# Patient Record
Sex: Female | Born: 1940 | Race: White | Hispanic: No | Marital: Married | Smoking: Never smoker
Health system: Southern US, Community
[De-identification: ages and names within clinical notes are randomized; demographics above are authoritative.]

## PROBLEM LIST (undated history)

## (undated) DIAGNOSIS — I10 Essential (primary) hypertension: Secondary | ICD-10-CM

## (undated) DIAGNOSIS — I480 Paroxysmal atrial fibrillation: Secondary | ICD-10-CM

## (undated) DIAGNOSIS — F0390 Unspecified dementia without behavioral disturbance: Secondary | ICD-10-CM

## (undated) DIAGNOSIS — I428 Other cardiomyopathies: Secondary | ICD-10-CM

## (undated) DIAGNOSIS — N183 Chronic kidney disease, stage 3 unspecified: Secondary | ICD-10-CM

## (undated) DIAGNOSIS — C50919 Malignant neoplasm of unspecified site of unspecified female breast: Secondary | ICD-10-CM

## (undated) DIAGNOSIS — E785 Hyperlipidemia, unspecified: Secondary | ICD-10-CM

## (undated) DIAGNOSIS — E039 Hypothyroidism, unspecified: Secondary | ICD-10-CM

## (undated) DIAGNOSIS — E079 Disorder of thyroid, unspecified: Secondary | ICD-10-CM

## (undated) HISTORY — PX: BREAST SURGERY: SHX581

## (undated) HISTORY — PX: APPENDECTOMY: SHX54

---

## 2000-05-16 ENCOUNTER — Encounter: Admission: RE | Admit: 2000-05-16 | Discharge: 2000-05-16 | Payer: Self-pay | Admitting: Obstetrics & Gynecology

## 2000-05-16 ENCOUNTER — Encounter: Payer: Self-pay | Admitting: Family Medicine

## 2000-05-19 ENCOUNTER — Encounter: Payer: Self-pay | Admitting: Family Medicine

## 2000-05-19 ENCOUNTER — Encounter: Admission: RE | Admit: 2000-05-19 | Discharge: 2000-05-19 | Payer: Self-pay | Admitting: Obstetrics & Gynecology

## 2002-01-25 ENCOUNTER — Encounter: Payer: Self-pay | Admitting: Family Medicine

## 2002-01-25 ENCOUNTER — Encounter: Admission: RE | Admit: 2002-01-25 | Discharge: 2002-01-25 | Payer: Self-pay | Admitting: Family Medicine

## 2002-01-29 ENCOUNTER — Encounter: Admission: RE | Admit: 2002-01-29 | Discharge: 2002-01-29 | Payer: Self-pay | Admitting: General Surgery

## 2002-01-29 ENCOUNTER — Encounter: Payer: Self-pay | Admitting: General Surgery

## 2002-02-01 ENCOUNTER — Ambulatory Visit (HOSPITAL_BASED_OUTPATIENT_CLINIC_OR_DEPARTMENT_OTHER): Admission: RE | Admit: 2002-02-01 | Discharge: 2002-02-01 | Payer: Self-pay | Admitting: General Surgery

## 2002-02-01 ENCOUNTER — Encounter: Admission: RE | Admit: 2002-02-01 | Discharge: 2002-02-01 | Payer: Self-pay | Admitting: General Surgery

## 2002-02-01 ENCOUNTER — Encounter: Payer: Self-pay | Admitting: General Surgery

## 2002-02-11 ENCOUNTER — Ambulatory Visit: Admission: RE | Admit: 2002-02-11 | Discharge: 2002-05-12 | Payer: Self-pay | Admitting: Radiation Oncology

## 2002-12-24 ENCOUNTER — Encounter: Payer: Self-pay | Admitting: General Surgery

## 2002-12-24 ENCOUNTER — Encounter: Admission: RE | Admit: 2002-12-24 | Discharge: 2002-12-24 | Payer: Self-pay | Admitting: General Surgery

## 2011-04-11 ENCOUNTER — Emergency Department (HOSPITAL_COMMUNITY): Payer: Medicare Other

## 2011-04-11 ENCOUNTER — Inpatient Hospital Stay (HOSPITAL_COMMUNITY)
Admission: EM | Admit: 2011-04-11 | Discharge: 2011-04-13 | DRG: 690 | Disposition: A | Discharge status: Home / Self Care | Payer: Medicare Other | Attending: Internal Medicine | Admitting: Internal Medicine

## 2011-04-11 ENCOUNTER — Encounter: Payer: Self-pay | Admitting: Oncology

## 2011-04-11 DIAGNOSIS — E119 Type 2 diabetes mellitus without complications: Secondary | ICD-10-CM | POA: Diagnosis present

## 2011-04-11 DIAGNOSIS — R630 Anorexia: Secondary | ICD-10-CM | POA: Diagnosis present

## 2011-04-11 DIAGNOSIS — E039 Hypothyroidism, unspecified: Secondary | ICD-10-CM | POA: Diagnosis present

## 2011-04-11 DIAGNOSIS — R634 Abnormal weight loss: Secondary | ICD-10-CM

## 2011-04-11 DIAGNOSIS — F0390 Unspecified dementia without behavioral disturbance: Secondary | ICD-10-CM | POA: Diagnosis present

## 2011-04-11 DIAGNOSIS — E079 Disorder of thyroid, unspecified: Secondary | ICD-10-CM | POA: Diagnosis present

## 2011-04-11 DIAGNOSIS — Z853 Personal history of malignant neoplasm of breast: Secondary | ICD-10-CM

## 2011-04-11 DIAGNOSIS — R599 Enlarged lymph nodes, unspecified: Secondary | ICD-10-CM | POA: Diagnosis present

## 2011-04-11 DIAGNOSIS — I10 Essential (primary) hypertension: Secondary | ICD-10-CM | POA: Diagnosis present

## 2011-04-11 DIAGNOSIS — E785 Hyperlipidemia, unspecified: Secondary | ICD-10-CM | POA: Diagnosis present

## 2011-04-11 DIAGNOSIS — R911 Solitary pulmonary nodule: Secondary | ICD-10-CM | POA: Diagnosis present

## 2011-04-11 DIAGNOSIS — E860 Dehydration: Secondary | ICD-10-CM | POA: Diagnosis present

## 2011-04-11 DIAGNOSIS — N390 Urinary tract infection, site not specified: Secondary | ICD-10-CM | POA: Diagnosis present

## 2011-04-11 HISTORY — DX: Hyperlipidemia, unspecified: E78.5

## 2011-04-11 HISTORY — DX: Disorder of thyroid, unspecified: E07.9

## 2011-04-11 HISTORY — DX: Unspecified dementia, unspecified severity, without behavioral disturbance, psychotic disturbance, mood disturbance, and anxiety: F03.90

## 2011-04-11 HISTORY — DX: Malignant neoplasm of unspecified site of unspecified female breast: C50.919

## 2011-04-11 HISTORY — DX: Essential (primary) hypertension: I10

## 2011-04-11 LAB — CARDIAC PANEL(CRET KIN+CKTOT+MB+TROPI)
CK, MB: 1.4 ng/mL (ref 0.3–4.0)
Total CK: 29 U/L (ref 7–177)

## 2011-04-11 LAB — PROTIME-INR: INR: 1.14 (ref 0.00–1.49)

## 2011-04-11 LAB — URINE MICROSCOPIC-ADD ON

## 2011-04-11 LAB — BASIC METABOLIC PANEL
BUN: 19 mg/dL (ref 6–23)
Chloride: 102 mEq/L (ref 96–112)
Glucose, Bld: 116 mg/dL — ABNORMAL HIGH (ref 70–99)
Potassium: 3.8 mEq/L (ref 3.5–5.1)

## 2011-04-11 LAB — TSH: TSH: 7.47 u[IU]/mL — ABNORMAL HIGH (ref 0.350–4.500)

## 2011-04-11 LAB — URINALYSIS, ROUTINE W REFLEX MICROSCOPIC
Nitrite: NEGATIVE
Specific Gravity, Urine: >1.03 — ABNORMAL HIGH (ref 1.005–1.030)
pH: 5.5 (ref 5.0–8.0)

## 2011-04-11 LAB — CBC
Hemoglobin: 12 g/dL (ref 12.0–15.0)
RBC: 3.76 MIL/uL — ABNORMAL LOW (ref 3.87–5.11)
WBC: 9.3 10*3/uL (ref 4.0–10.5)

## 2011-04-11 LAB — DIFFERENTIAL
Lymphocytes Relative: 11 % — ABNORMAL LOW (ref 12–46)
Lymphs Abs: 1.1 10*3/uL (ref 0.7–4.0)
Monocytes Relative: 9 % (ref 3–12)
Neutro Abs: 7.3 10*3/uL (ref 1.7–7.7)
Neutrophils Relative %: 78 % — ABNORMAL HIGH (ref 43–77)

## 2011-04-11 LAB — HEPATIC FUNCTION PANEL
Albumin: 2.6 g/dL — ABNORMAL LOW (ref 3.5–5.2)
Total Protein: 6.9 g/dL (ref 6.0–8.3)

## 2011-04-11 LAB — D-DIMER, QUANTITATIVE: D-Dimer, Quant: 1.15 ug/mL-FEU — ABNORMAL HIGH (ref 0.00–0.48)

## 2011-04-11 LAB — LIPASE, BLOOD: Lipase: 38 U/L (ref 11–59)

## 2011-04-11 MED ORDER — ONDANSETRON HCL 4 MG PO TABS
4.0000 mg | ORAL_TABLET | Freq: Four times a day (QID) | ORAL | Status: DC | PRN
  Administered 2011-04-12: 4 mg via ORAL
  Filled 2011-04-11: qty 1

## 2011-04-11 MED ORDER — FENOFIBRATE 160 MG PO TABS
160.0000 mg | ORAL_TABLET | ORAL | Status: DC
  Administered 2011-04-12 – 2011-04-13 (×2): 160 mg via ORAL
  Filled 2011-04-11 (×4): qty 1

## 2011-04-11 MED ORDER — ONDANSETRON HCL 4 MG/2ML IJ SOLN: 4.0000 mg | Freq: Four times a day (QID) | INTRAMUSCULAR | Status: DC | PRN

## 2011-04-11 MED ORDER — TRAZODONE HCL 50 MG PO TABS: 25.0000 mg | ORAL_TABLET | Freq: Every evening | ORAL | Status: DC | PRN

## 2011-04-11 MED ORDER — DONEPEZIL HCL 5 MG PO TABS
10.0000 mg | ORAL_TABLET | Freq: Every day | ORAL | Status: DC
  Administered 2011-04-11 – 2011-04-12 (×2): 10 mg via ORAL
  Filled 2011-04-11 (×2): qty 2

## 2011-04-11 MED ORDER — POLYETHYLENE GLYCOL 3350 17 G PO PACK: 17.0000 g | PACK | Freq: Every day | ORAL | Status: DC | PRN

## 2011-04-11 MED ORDER — OXYCODONE HCL 5 MG PO TABS
5.0000 mg | ORAL_TABLET | ORAL | Status: DC | PRN
  Administered 2011-04-11 – 2011-04-13 (×3): 5 mg via ORAL
  Filled 2011-04-11 (×3): qty 1

## 2011-04-11 MED ORDER — IOHEXOL 350 MG/ML SOLN
80.0000 mL | Freq: Once | INTRAVENOUS | Status: AC | PRN
  Administered 2011-04-11: 80 mL via INTRAVENOUS

## 2011-04-11 MED ORDER — ACETAMINOPHEN 325 MG PO TABS: 650.0000 mg | ORAL_TABLET | Freq: Four times a day (QID) | ORAL | Status: DC | PRN

## 2011-04-11 MED ORDER — ACETAMINOPHEN 650 MG RE SUPP: 650.0000 mg | Freq: Four times a day (QID) | RECTAL | Status: DC | PRN

## 2011-04-11 MED ORDER — BISACODYL 10 MG RE SUPP: 10.0000 mg | Freq: Every day | RECTAL | Status: DC | PRN

## 2011-04-11 MED ORDER — INSULIN ASPART 100 UNIT/ML ~~LOC~~ SOLN: 0.0000 [IU] | Freq: Every day | SUBCUTANEOUS | Status: DC

## 2011-04-11 MED ORDER — LEVOTHYROXINE SODIUM 75 MCG PO TABS
75.0000 ug | ORAL_TABLET | ORAL | Status: DC
Start: 2011-04-12 — End: 2011-04-13
  Administered 2011-04-12 – 2011-04-13 (×2): 75 ug via ORAL
  Filled 2011-04-11 (×2): qty 1

## 2011-04-11 MED ORDER — FLEET ENEMA 7-19 GM/118ML RE ENEM: 1.0000 | ENEMA | Freq: Once | RECTAL | Status: AC | PRN

## 2011-04-11 MED ORDER — SIMVASTATIN 20 MG PO TABS: 40.0000 mg | ORAL_TABLET | Freq: Every day | ORAL | Status: DC

## 2011-04-11 MED ORDER — SODIUM CHLORIDE 0.9 % IV SOLN
INTRAVENOUS | Status: DC
  Administered 2011-04-11: 19:00:00 via INTRAVENOUS

## 2011-04-11 MED ORDER — SODIUM CHLORIDE 0.9 % IV SOLN
INTRAVENOUS | Status: DC
  Administered 2011-04-11: 23:00:00 via INTRAVENOUS
  Administered 2011-04-11: 1000 mL via INTRAVENOUS
  Administered 2011-04-12: 07:00:00 via INTRAVENOUS

## 2011-04-11 MED ORDER — AMLODIPINE BESYLATE 5 MG PO TABS
10.0000 mg | ORAL_TABLET | Freq: Every day | ORAL | Status: DC
  Administered 2011-04-11 – 2011-04-12 (×2): 10 mg via ORAL
  Filled 2011-04-11 (×2): qty 2

## 2011-04-11 MED ORDER — HYDROMORPHONE HCL PF 1 MG/ML IJ SOLN: 0.5000 mg | INTRAMUSCULAR | Status: DC | PRN

## 2011-04-11 MED ORDER — INSULIN ASPART 100 UNIT/ML ~~LOC~~ SOLN: 0.0000 [IU] | Freq: Three times a day (TID) | SUBCUTANEOUS | Status: DC

## 2011-04-11 NOTE — ED Notes (Signed)
Pt arrived via [facility] - family reports pt has had a several day history of poor intake and generalized weakness.  Pt denies cp, shob, n/v/d, or fevers.

## 2011-04-11 NOTE — ED Notes (Signed)
Report called  

## 2011-04-11 NOTE — ED Provider Notes (Signed)
History     CSN: [id number] Arrival date & time: 04/11/2011  1:03 PM   First MD Initiated Contact with Patient 04/11/11 1407      Chief Complaint  Patient presents with  . Weakness    (Consider location/radiation/quality/duration/timing/severity/associated sxs/prior treatment) HPI Comments: Pt is a 70year-old who has not been eating for over a week. Her family is concerned, and they called her primary care physician, Rudi Heap M.D., who advised evaluation in [facility] for this condition. She has a prior history of breast cancer, and has dementia.  Patient is a 70 y.o. female presenting with weakness. The history is provided by a relative and medical records. No language interpreter was used.  Weakness The primary symptoms include altered mental status and memory loss. Primary symptoms comment: She has not eaten much in the past week or so. The symptoms began more than 1 week ago. The symptoms are unchanged. The neurological symptoms are diffuse.  Additional symptoms include weakness and irritability. Medical issues also include cancer.    Past Medical History  Diagnosis Date  . Dementia   . Thyroid disease   . Diabetes mellitus   . Hypertension   . Hyperlipidemia   . Breast cancer     History reviewed. No pertinent past surgical history.  History reviewed. No pertinent family history.  History  Substance Use Topics  . Smoking status: Not on file  . Smokeless tobacco: Not on file  . Alcohol Use: No    OB History    Grav Para Term Preterm Abortions TAB SAB Ect Mult Living                  Review of Systems  Unable to perform ROS: Dementia  Constitutional: Positive for irritability.  Neurological: Positive for weakness.  Psychiatric/Behavioral: Positive for memory loss and altered mental status.    Allergies  Penicillins  Home Medications   Current Outpatient Rx  Name Route Sig Dispense Refill  . AMLODIPINE BESYLATE 10 MG PO TABS Oral Take 10 mg by  mouth daily.      . ATORVASTATIN CALCIUM 20 MG PO TABS Oral Take 20 mg by mouth daily.      . DONEPEZIL HCL 10 MG PO TABS Oral Take 10 mg by mouth at bedtime as needed.      . FENOFIBRATE 160 MG PO TABS Oral Take 160 mg by mouth daily.      Marland Kitchen LEVOTHYROXINE SODIUM 75 MCG PO TABS Oral Take 75 mcg by mouth daily.      Marland Kitchen LISINOPRIL 20 MG PO TABS Oral Take 20 mg by mouth daily.      Marland Kitchen LOVASTATIN 40 MG PO TABS Oral Take 40 mg by mouth at bedtime. 2 tablets at hs     . POTASSIUM CHLORIDE CRYS CR 20 MEQ PO TBCR Oral Take 20 mEq by mouth 2 (two) times daily.        BP 140/66  Temp(Src) 98.9 F (37.2 C) (Oral)  Resp 18  SpO2 100%  Physical Exam  Constitutional:       Patient is an elderly woman who seems forgetful.  HENT:  Head: Normocephalic and atraumatic.  Right Ear: External ear normal.  Left Ear: External ear normal.  Mouth/Throat: Oropharynx is clear and moist.  Eyes: Conjunctivae and EOM are normal. Pupils are equal, round, and reactive to light. No scleral icterus.  Neck: Normal range of motion. Neck supple.  Cardiovascular: Normal rate, regular rhythm and normal heart sounds.  Pulmonary/Chest: Effort normal and breath sounds normal.  Abdominal: Soft. Bowel sounds are normal.  Musculoskeletal: Normal range of motion.  Neurological: She is alert.       She is oriented to person and place but not to time.  There is no focal weakness or numbness. She has a disregard for her symptoms.  Skin: Skin is warm and dry.  Psychiatric:       She seems to have poor insight into her condition.    ED Course  Procedures (including critical care time)  Labs Reviewed  CBC - Abnormal; Notable for the following:    RBC 3.76 (*)    All other components within normal limits  DIFFERENTIAL - Abnormal; Notable for the following:    Neutrophils Relative 78 (*)    Lymphocytes Relative 11 (*)    All other components within normal limits  URINALYSIS, ROUTINE W REFLEX MICROSCOPIC - Abnormal; Notable  for the following:    Color, Urine ORANGE (*) BIOCHEMICALS MAY BE AFFECTED BY COLOR   APPearance HAZY (*)    Specific Gravity, Urine >1.030 (*)    Hgb urine dipstick TRACE (*)    Bilirubin Urine SMALL (*)    Protein, ur TRACE (*)    Urobilinogen, UA 2.0 (*)    Leukocytes, UA MODERATE (*)    All other components within normal limits  URINE MICROSCOPIC-ADD ON - Abnormal; Notable for the following:    Squamous Epithelial / LPF MANY (*)    Bacteria, UA MANY (*)    Casts GRANULAR CAST (*) HYALINE CASTS   All other components within normal limits  BASIC METABOLIC PANEL   1:61 PM Patient was seen and had physical examination. Laboratory tests were ordered.  2:55 PM  Date: 04/11/2011  Rate:92  Rhythm: normal sinus rhythm  QRS Axis: left  Intervals: PR prolonged QRS:  Left bundle branch block.    ST/T Wave abnormalities: normal  Conduction Disutrbances:first-degree A-V block  and left bundle branch block  Narrative Interpretation: Abnormal EKG  Old EKG Reviewed: none available  4:27 PM CT of the head and chest x-ray were negative. D-dimer was somewhat elevated. Urinalysis showed a UTI. I discussed all these findings with the patient and her husband and son. CT angiography chest was ordered to check for pulmonary embolism.  5:36 PM CT angio of the chest did not show any pulmonary emboli, but did show pulmonary nodules.  The radiologist recommended comparison with any outside films to check for possible metatstases.  Will call [facility] to admit her for treatment of UTI, testing for possible metastases from breast cancer.  1. Dementia   2. Urinary tract infection   3. Pulmonary nodule           Carleene Cooper III, MD 04/11/11 [phone]

## 2011-04-11 NOTE — ED Notes (Signed)
Alert, no distress, family at bedside.  Attempted to give report, but nurse not ready.

## 2011-04-11 NOTE — H&P (Signed)
PCP:   Redmond Baseman, MD, MD   Chief Complaint:  Not eating for about one week  HPI: Kristen Wheeler is an 70 y.o. Caucasian female.   Multiple stable chronic medical problems, including mild-to-moderate dementia; lives with elderly husband; son comes in to visit frequently. Brought to the emergency room because of noticeable anorexia for the past week; son says she's only eaten one sandwich the whole week, although patient disputes that. Nevertheless because of her dementia patient is very vague when attempting to correct her son. She admits to poor appetite, episodic abdominal discomfort and chest discomfort; and frequent leg cramps. She admits to progressive weight loss over the years; 80 pounds over the past 5 years, at least 10 pounds over the past few weeks, but reports she's happy with the weight loss because she's always had a problem excess weight.  Patient does have a past history of breast cancer, apparently treated with lumpectomy and radiation, has not needed medical treatment for this for over 9 years.  In the emergency room patient was noted to have mild to moderate confusion, she was dehydrated, and for reasons unclear a d-dimer was done and since it was elevated CT angiogram was done also, and this revealed a 7 mm lung nodule. Given her past cancer history the hospitalist service was called to assist with management.  She denies fever cough or cold; denies frequency or dysuria; denies bloody or black stool; denies difficulty with ambulation or balance difficulties; as episodic headaches which have been unchanged for many years; denies significant cough; no smoking history.  Rewiew of Systems:  The patient denies  fever, vision loss, decreased hearing, hoarseness,  syncope, dyspnea on exertion, peripheral edema, balance deficits, hemoptysis, , melena, hematochezia, severe indigestion/heartburn, hematuria, incontinence, genital sores, muscle weakness, suspicious skin lesions,  transient blindness, difficulty walking, depression,  abnormal bleeding, enlarged lymph nodes, angioedema, and breast masses.    Past Medical History  Diagnosis Date  . Dementia   . Thyroid disease   . Diabetes mellitus   . Hypertension   . Hyperlipidemia   . Breast cancer     History reviewed. No pertinent past surgical history.   Medications:  HOME MEDS: Prior to Admission medications   Medication Sig Start Date End Date Taking? Authorizing Provider  amLODipine (NORVASC) 10 MG tablet Take 10 mg by mouth at bedtime.    Yes Historical Provider, MD  atorvastatin (LIPITOR) 20 MG tablet Take 20 mg by mouth at bedtime.    Yes Historical Provider, MD  donepezil (ARICEPT) 10 MG tablet Take 10 mg by mouth at bedtime.    Yes Historical Provider, MD  fenofibrate 160 MG tablet Take 160 mg by mouth every morning.    Yes Historical Provider, MD  levothyroxine (SYNTHROID, LEVOTHROID) 75 MCG tablet Take 75 mcg by mouth every morning.    Yes Historical Provider, MD  lisinopril (PRINIVIL,ZESTRIL) 20 MG tablet Take 20 mg by mouth every morning.    Yes Historical Provider, MD  lovastatin (MEVACOR) 40 MG tablet Take 80 mg by mouth at bedtime. 2 tablets at hs   Yes Historical Provider, MD  potassium chloride SA (K-DUR,KLOR-CON) 20 MEQ tablet Take 20 mEq by mouth 2 (two) times daily.     Yes Historical Provider, MD     Allergies:  Allergies  Allergen Reactions  . Penicillins     Advised was allergic to penicillin early in life.    Social History:   does not have a smoking history on file.  She does not have any smokeless tobacco history on file. She reports that she does not drink alcohol or use illicit drugs.  Family History: History reviewed. No pertinent family history. Coronary artery disease in both parents; mother also diabetic.  Physical Exam: Filed Vitals:   04/11/11 1309 04/11/11 1519 04/11/11 1739 04/11/11 1911  BP: 140/66 143/61 137/63 125/62  Pulse:  94 103 96  Temp: 98.9 F  (37.2 C)     TempSrc: Oral     Resp: 18  16 20   SpO2: 100% 98% 99% 98%   Blood pressure 125/62, pulse 96, temperature 98.9 F (37.2 C), temperature source Oral, resp. rate 20, SpO2 98.00%.  GEN:  Pleasant elderly Caucasian lady lying in the stretcher in no acute distress; cooperative with exam PSYCH:  alert and oriented x1; does not appear anxious does not appear depressed; affect is normal HEENT: Mucous membranes pink and dry and anicteric; PERRLA; EOM intact; no cervical lymphadenopathy nor thyromegaly or carotid bruit; no JVD; Breasts:: Not examined CHEST WALL: No tenderness CHEST: Normal respiration, clear to auscultation bilaterally HEART: Gallop rhythm; 3/6 systolic murmur with accentuated S2 BACK: No kyphosis or scoliosis; no CVA tenderness ABDOMEN:  soft non-tender; no masses, no organomegaly, normal abdominal bowel sounds; no pannus; no intertriginous candida. Rectal Exam: Not done EXTREMITIES:  age-appropriate arthropathy of the hands and knees; no edema; no ulcerations. Genitalia: not examined PULSES: 2+ and symmetric SKIN: Normal hydration no rash or ulceration CNS: Cranial nerves 2-12 grossly intact no focal neurologic deficit   Labs & Imaging Results for orders placed during the hospital encounter of 04/11/11 (from the past 48 hour(s))  CBC     Status: Abnormal   Collection Time   04/11/11  1:21 PM      Component Value Range Comment   WBC 9.3  4.0 - 10.5 (K/uL)    RBC 3.76 (*) 3.87 - 5.11 (MIL/uL)    Hemoglobin 12.0  12.0 - 15.0 (g/dL)    HCT 56.2  13.0 - 86.5 (%)    MCV 98.9  78.0 - 100.0 (fL)    MCH 31.9  26.0 - 34.0 (pg)    MCHC 32.3  30.0 - 36.0 (g/dL)    RDW 78.4  69.6 - 29.5 (%)    Platelets 246  150 - 400 (K/uL)   DIFFERENTIAL     Status: Abnormal   Collection Time   04/11/11  1:21 PM      Component Value Range Comment   Neutrophils Relative 78 (*) 43 - 77 (%)    Neutro Abs 7.3  1.7 - 7.7 (K/uL)    Lymphocytes Relative 11 (*) 12 - 46 (%)    Lymphs  Abs 1.1  0.7 - 4.0 (K/uL)    Monocytes Relative 9  3 - 12 (%)    Monocytes Absolute 0.9  0.1 - 1.0 (K/uL)    Eosinophils Relative 1  0 - 5 (%)    Eosinophils Absolute 0.1  0.0 - 0.7 (K/uL)    Basophils Relative 0  0 - 1 (%)    Basophils Absolute 0.0  0.0 - 0.1 (K/uL)   URINALYSIS, ROUTINE W REFLEX MICROSCOPIC     Status: Abnormal   Collection Time   04/11/11  1:25 PM      Component Value Range Comment   Color, Urine ORANGE (*) YELLOW  BIOCHEMICALS MAY BE AFFECTED BY COLOR   APPearance HAZY (*) CLEAR     Specific Gravity, Urine >1.030 (*) 1.005 - 1.030  pH 5.5  5.0 - 8.0     Glucose, UA NEGATIVE  NEGATIVE (mg/dL)    Hgb urine dipstick TRACE (*) NEGATIVE     Bilirubin Urine SMALL (*) NEGATIVE     Ketones, ur NEGATIVE  NEGATIVE (mg/dL)    Protein, ur TRACE (*) NEGATIVE (mg/dL)    Urobilinogen, UA 2.0 (*) 0.0 - 1.0 (mg/dL)    Nitrite NEGATIVE  NEGATIVE     Leukocytes, UA MODERATE (*) NEGATIVE    URINE MICROSCOPIC-ADD ON     Status: Abnormal   Collection Time   04/11/11  1:25 PM      Component Value Range Comment   Squamous Epithelial / LPF MANY (*) RARE     WBC, UA TOO NUMEROUS TO COUNT  <3 (WBC/hpf)    RBC / HPF 3-6  <3 (RBC/hpf)    Bacteria, UA MANY (*) RARE     Casts GRANULAR CAST (*) NEGATIVE  HYALINE CASTS   Urine-Other RENAL     BASIC METABOLIC PANEL     Status: Abnormal   Collection Time   04/11/11  1:59 PM      Component Value Range Comment   Sodium 136  135 - 145 (mEq/L)    Potassium 3.8  3.5 - 5.1 (mEq/L)    Chloride 102  96 - 112 (mEq/L)    CO2 28  19 - 32 (mEq/L)    Glucose, Bld 116 (*) 70 - 99 (mg/dL)    BUN 19  6 - 23 (mg/dL)    Creatinine, Ser 9.60 (*) 0.50 - 1.10 (mg/dL)    Calcium 45.4 (*) 8.4 - 10.5 (mg/dL)    GFR calc non Af Amer 42 (*) >90 (mL/min)    GFR calc Af Amer 49 (*) >90 (mL/min)   CARDIAC PANEL(CRET KIN+CKTOT+MB+TROPI)     Status: Normal   Collection Time   04/11/11  2:00 PM      Component Value Range Comment   Total CK 29  7 - 177 (U/L)     CK, MB 1.4  0.3 - 4.0 (ng/mL)    Troponin I <0.30  <0.30 (ng/mL)    Relative Index RELATIVE INDEX IS INVALID  0.0 - 2.5    D-DIMER, QUANTITATIVE     Status: Abnormal   Collection Time   04/11/11  2:00 PM      Component Value Range Comment   D-Dimer, Quant 1.15 (*) 0.00 - 0.48 (ug/mL-FEU)   HEPATIC FUNCTION PANEL     Status: Abnormal   Collection Time   04/11/11  2:00 PM      Component Value Range Comment   Total Protein 6.9  6.0 - 8.3 (g/dL)    Albumin 2.6 (*) 3.5 - 5.2 (g/dL)    AST 25  0 - 37 (U/L)    ALT 9  0 - 35 (U/L)    Alkaline Phosphatase 88  39 - 117 (U/L)    Total Bilirubin 0.9  0.3 - 1.2 (mg/dL)    Bilirubin, Direct 0.4 (*) 0.0 - 0.3 (mg/dL)    Indirect Bilirubin 0.5  0.3 - 0.9 (mg/dL)   LIPASE, BLOOD     Status: Normal   Collection Time   04/11/11  2:00 PM      Component Value Range Comment   Lipase 38  11 - 59 (U/L)    Dg Chest 2 View  04/11/2011  *RADIOLOGY REPORT*  Clinical Data: Dimension, breast cancer, anorexia  CHEST - 2 VIEW  Comparison: None.  Findings: The cardiac  silhouette, mediastinum, pulmonary vasculature are within normal limits.  Both lungs are clear. There is no acute bony abnormality.  IMPRESSION: There is no evidence of acute cardiac or pulmonary process.  Original Report Authenticated By: Brandon Melnick, M.D.   Ct Head Wo Contrast  04/11/2011  *RADIOLOGY REPORT*  Clinical Data: Dimension.  CT HEAD WITHOUT CONTRAST  Technique:  Contiguous axial images were obtained from the base of the skull through the vertex without contrast.  Comparison: None  Findings: No acute intracranial abnormality.  Specifically, no hemorrhage, hydrocephalus, mass lesion, acute infarction, or significant intracranial injury.  No acute calvarial abnormality. Minimal chronic small vessel disease in the deep white matter. Visualized paranasal sinuses and mastoids clear.  Orbital soft tissues unremarkable.  IMPRESSION: No acute intracranial abnormality.  Original Report  Authenticated By: Cyndie Chime, M.D.   Ct Angio Chest W/cm &/or Wo Cm  04/11/2011  *RADIOLOGY REPORT*  Clinical Data:  Dementia, not eating, weakness, diabetes, hypertension, elevated D-dimer, past history left breast cancer post chemotherapy  CT ANGIOGRAPHY CHEST WITH CONTRAST  Technique:  Multidetector CT imaging of the chest was performed using the standard protocol during bolus administration of intravenous contrast.  Multiplanar CT image reconstructions including MIPs were obtained to evaluate the vascular anatomy.  Contrast: 80mL OMNIPAQUE IOHEXOL 350 MG/ML IV SOLN  Comparison:  None  Findings: Aorta normal caliber without aneurysm or dissection. Scattered atherosclerotic calcifications aorta and coronary arteries. Visualized portion of upper abdomen unremarkable. Left axillary adenopathy, enlarged node measuring 1.9 x 2.3 cm image 14. No additional thoracic adenopathy. Pulmonary arteries appear grossly patent. Axial images demonstrate a questionable filling defect in a right lower lobe pulmonary artery though on sagittal and coronal images this appears be due to either a kink in the vessel. No definite evidence of pulmonary embolism. Dependent atelectasis in both lungs. 7 mm diameter nonspecific right lower lobe nodule image 47. Scattered end plate spur formation thoracic spine. No definite acute bony lesions.  Review of the MIP images confirms the above findings.  IMPRESSION: No definite evidence of pulmonary embolism as above. Right lower lobe pulmonary nodule 7 mm diameter. Enlarged left axillary lymph node 2.3 x 1.9 cm. Metastatic disease not excluded in this patient with a history of breast cancer. Recommend comparison to prior outside exams to determine stability of these findings.  Original Report Authenticated By: Lollie Marrow, M.D.      Assessment  Present on Admission:  .Anorexia .Weight loss, unintentional .Dehydration  .Lung nodule   .UTI (lower urinary tract  infection)  .Dementia .Hyperlipidemia .Hypertension .Thyroid disease .Diabetes mellitus   PLAN: Admit this lady for hydration and treatment of her urinary tract infection.  consider PET scan for her lung nodule Consider scheduling outpatient GI workup for her episodic chest and abdominal pain and weight loss. Continue management of her chronic medical conditions.  Other plans as per orders.   Itzabella Sorrels 04/11/2011, 8:18 PM

## 2011-04-12 ENCOUNTER — Encounter (HOSPITAL_COMMUNITY): Payer: Self-pay | Admitting: Internal Medicine

## 2011-04-12 LAB — HEMOGLOBIN A1C
Hgb A1c MFr Bld: 5.2 % (ref <5.7)
Mean Plasma Glucose: 103 mg/dL (ref <117)

## 2011-04-12 LAB — GLUCOSE, CAPILLARY
Glucose-Capillary: 101 mg/dL — ABNORMAL HIGH (ref 70–99)
Glucose-Capillary: 102 mg/dL — ABNORMAL HIGH (ref 70–99)
Glucose-Capillary: 76 mg/dL (ref 70–99)

## 2011-04-12 LAB — CBC
Hemoglobin: 10.2 g/dL — ABNORMAL LOW (ref 12.0–15.0)
MCHC: 32.8 g/dL (ref 30.0–36.0)
RBC: 3.14 MIL/uL — ABNORMAL LOW (ref 3.87–5.11)
WBC: 7 10*3/uL (ref 4.0–10.5)

## 2011-04-12 LAB — BASIC METABOLIC PANEL
GFR calc Af Amer: 64 mL/min — ABNORMAL LOW (ref >90)
GFR calc non Af Amer: 55 mL/min — ABNORMAL LOW (ref >90)
Potassium: 3.5 mEq/L (ref 3.5–5.1)
Sodium: 135 mEq/L (ref 135–145)

## 2011-04-12 LAB — URINE CULTURE: Colony Count: NO GROWTH

## 2011-04-12 MED ORDER — CIPROFLOXACIN IN D5W 400 MG/200ML IV SOLN
INTRAVENOUS | Status: AC
  Filled 2011-04-12: qty 200

## 2011-04-12 MED ORDER — PANTOPRAZOLE SODIUM 40 MG PO TBEC
40.0000 mg | DELAYED_RELEASE_TABLET | Freq: Every day | ORAL | Status: DC
  Administered 2011-04-12: 40 mg via ORAL
  Filled 2011-04-12: qty 1

## 2011-04-12 MED ORDER — CIPROFLOXACIN HCL 250 MG PO TABS
500.0000 mg | ORAL_TABLET | Freq: Two times a day (BID) | ORAL | Status: DC
  Administered 2011-04-12 – 2011-04-13 (×3): 500 mg via ORAL
  Filled 2011-04-12 (×3): qty 2

## 2011-04-12 MED ORDER — CIPROFLOXACIN IN D5W 400 MG/200ML IV SOLN
400.0000 mg | INTRAVENOUS | Status: DC
  Administered 2011-04-12: 400 mg via INTRAVENOUS
  Filled 2011-04-12 (×4): qty 200

## 2011-04-12 MED ORDER — FENOFIBRATE 145 MG PO TABS
ORAL_TABLET | ORAL | Status: AC
  Filled 2011-04-12: qty 1

## 2011-04-12 MED ORDER — ROSUVASTATIN CALCIUM 20 MG PO TABS
10.0000 mg | ORAL_TABLET | Freq: Every day | ORAL | Status: DC
  Administered 2011-04-12: 10 mg via ORAL
  Filled 2011-04-12: qty 1

## 2011-04-12 NOTE — Progress Notes (Signed)
Subjective: This lady was admitted yesterday with UTI and abnormal findings on CT angiogram of the chest which included a 7 mm lung nodule and lymphadenopathy. She does have a history of breast cancer 9 years ago and is not being followed up by an oncologist anymore. She feels better today and her nausea is improved. She has not eaten much today however.           Physical Exam: Blood pressure 111/67, pulse 74, temperature 98.1 F (36.7 C), temperature source Oral, resp. rate 20, height 5\' 3"  (1.6 m), weight 72.576 kg (160 lb), SpO2 98.00%. She looks systemically well. She is not toxic or septic. Heart sounds are present and normal. Lung fields are clear. Abdomen is soft and nontender with no evidence of hepatomegaly or any masses. She is alert and orientated. She apparently does have a diagnosis of mild to moderate dementia.   Investigations:     Basic Metabolic Panel:  Basename 04/12/11 0453 04/11/11 1359  NA 135 136  K 3.5 3.8  CL 105 102  CO2 25 28  GLUCOSE 138* 116*  BUN 15 19  CREATININE 1.01 1.26*  CALCIUM 10.1 11.3*  MG -- --  PHOS -- --   Liver Function Tests:  Basename 04/11/11 1400  AST 25  ALT 9  ALKPHOS 88  BILITOT 0.9  PROT 6.9  ALBUMIN 2.6*     CBC:  Basename 04/12/11 0453 04/11/11 1321  WBC 7.0 9.3  NEUTROABS -- 7.3  HGB 10.2* 12.0  HCT 31.1* 37.2  MCV 99.0 98.9  PLT 167 246    Dg Chest 2 View  04/11/2011  *RADIOLOGY REPORT*  Clinical Data: Dimension, breast cancer, anorexia  CHEST - 2 VIEW  Comparison: None.  Findings: The cardiac silhouette, mediastinum, pulmonary vasculature are within normal limits.  Both lungs are clear. There is no acute bony abnormality.  IMPRESSION: There is no evidence of acute cardiac or pulmonary process.  Original Report Authenticated By: Brandon Melnick, M.D.   Ct Head Wo Contrast  04/11/2011  *RADIOLOGY REPORT*  Clinical Data: Dimension.  CT HEAD WITHOUT CONTRAST  Technique:  Contiguous axial images were  obtained from the base of the skull through the vertex without contrast.  Comparison: None  Findings: No acute intracranial abnormality.  Specifically, no hemorrhage, hydrocephalus, mass lesion, acute infarction, or significant intracranial injury.  No acute calvarial abnormality. Minimal chronic small vessel disease in the deep white matter. Visualized paranasal sinuses and mastoids clear.  Orbital soft tissues unremarkable.  IMPRESSION: No acute intracranial abnormality.  Original Report Authenticated By: Cyndie Chime, M.D.   Ct Angio Chest W/cm &/or Wo Cm  04/11/2011  *RADIOLOGY REPORT*  Clinical Data:  Dementia, not eating, weakness, diabetes, hypertension, elevated D-dimer, past history left breast cancer post chemotherapy  CT ANGIOGRAPHY CHEST WITH CONTRAST  Technique:  Multidetector CT imaging of the chest was performed using the standard protocol during bolus administration of intravenous contrast.  Multiplanar CT image reconstructions including MIPs were obtained to evaluate the vascular anatomy.  Contrast: 80mL OMNIPAQUE IOHEXOL 350 MG/ML IV SOLN  Comparison:  None  Findings: Aorta normal caliber without aneurysm or dissection. Scattered atherosclerotic calcifications aorta and coronary arteries. Visualized portion of upper abdomen unremarkable. Left axillary adenopathy, enlarged node measuring 1.9 x 2.3 cm image 14. No additional thoracic adenopathy. Pulmonary arteries appear grossly patent. Axial images demonstrate a questionable filling defect in a right lower lobe pulmonary artery though on sagittal and coronal images this appears be due to either  a kink in the vessel. No definite evidence of pulmonary embolism. Dependent atelectasis in both lungs. 7 mm diameter nonspecific right lower lobe nodule image 47. Scattered end plate spur formation thoracic spine. No definite acute bony lesions.  Review of the MIP images confirms the above findings.  IMPRESSION: No definite evidence of pulmonary  embolism as above. Right lower lobe pulmonary nodule 7 mm diameter. Enlarged left axillary lymph node 2.3 x 1.9 cm. Metastatic disease not excluded in this patient with a history of breast cancer. Recommend comparison to prior outside exams to determine stability of these findings.  Original Report Authenticated By: Lollie Marrow, M.D.      Medications: I have reviewed the patient's current medications.  Impression: 1. UTI based on abnormal urinalysis and symptoms of dysuria. 2. Remote history of breast cancer. 3. New 7 mm lung nodule and lymphadenopathy, unclear significance at this point. 4. Hypothyroidism. 5. Mild to moderate dementia. 6. Dehydration, improved.     Plan: 1. Discontinue IV fluids. 2. Change intravenous ciprofloxacin to oral ciprofloxacin. 3. Encourage diet. 4. She will need outpatient oncology followup and possibly a PET scan.     LOS: 1 day   Wilson Singer Pager [phone]  04/12/2011, 12:25 PM

## 2011-04-12 NOTE — Progress Notes (Signed)
UR Chart Review Completed  

## 2011-04-13 LAB — COMPREHENSIVE METABOLIC PANEL
ALT: 7 U/L (ref 0–35)
Calcium: 10.4 mg/dL (ref 8.4–10.5)
GFR calc Af Amer: 56 mL/min — ABNORMAL LOW (ref >90)
Glucose, Bld: 98 mg/dL (ref 70–99)
Sodium: 136 mEq/L (ref 135–145)
Total Protein: 5.9 g/dL — ABNORMAL LOW (ref 6.0–8.3)

## 2011-04-13 LAB — GLUCOSE, CAPILLARY

## 2011-04-13 LAB — CBC
Hemoglobin: 10.2 g/dL — ABNORMAL LOW (ref 12.0–15.0)
MCH: 32.3 pg (ref 26.0–34.0)
MCHC: 32.8 g/dL (ref 30.0–36.0)

## 2011-04-13 LAB — HEMOGLOBIN A1C: Hgb A1c MFr Bld: 5.4 % (ref <5.7)

## 2011-04-13 MED ORDER — CIPROFLOXACIN HCL 500 MG PO TABS: 500.0000 mg | ORAL_TABLET | Freq: Two times a day (BID) | ORAL | Status: DC

## 2011-04-13 NOTE — Discharge Summary (Signed)
Physician Discharge Summary  Patient ID: Kristen Wheeler MRN: [id number] DOB/AGE: 1941-04-20 70 y.o. Primary Care Physician:WONG,FRANCIS PATRICK, MD, MD Admit date: 04/11/2011 Discharge date: 04/13/2011    Discharge Diagnoses:  1. UTI. 2. New right lower lobe pulmonary nodule 7 mm in diameter. 3. Left axillary lymphadenopathy. 4. History of breast cancer. 5. Mild to moderate dementia. 6. Hypertension. 7. Hypothyroidism. 8. Hyperlipidemia. 9. Dehydration, improved.   Current Discharge Medication List    START taking these medications   Details  ciprofloxacin (CIPRO) 500 MG tablet Take 1 tablet (500 mg total) by mouth 2 (two) times daily. Qty: 10 tablet, Refills: 0      CONTINUE these medications which have NOT CHANGED   Details  amLODipine (NORVASC) 10 MG tablet Take 10 mg by mouth at bedtime.     atorvastatin (LIPITOR) 20 MG tablet Take 20 mg by mouth at bedtime.     donepezil (ARICEPT) 10 MG tablet Take 10 mg by mouth at bedtime.     fenofibrate 160 MG tablet Take 160 mg by mouth every morning.     levothyroxine (SYNTHROID, LEVOTHROID) 75 MCG tablet Take 75 mcg by mouth every morning.     lovastatin (MEVACOR) 40 MG tablet Take 80 mg by mouth at bedtime. 2 tablets at hs      STOP taking these medications     lisinopril (PRINIVIL,ZESTRIL) 20 MG tablet      potassium chloride SA (K-DUR,KLOR-CON) 20 MEQ tablet         Discharged Condition: Improved and stable.    Consults: None.  Significant Diagnostic Studies: Dg Chest 2 View  04/11/2011  *RADIOLOGY REPORT*  Clinical Data: Dimension, breast cancer, anorexia  CHEST - 2 VIEW  Comparison: None.  Findings: The cardiac silhouette, mediastinum, pulmonary vasculature are within normal limits.  Both lungs are clear. There is no acute bony abnormality.  IMPRESSION: There is no evidence of acute cardiac or pulmonary process.  Original Report Authenticated By: Brandon Melnick, M.D.   Ct Head Wo  Contrast  04/11/2011  *RADIOLOGY REPORT*  Clinical Data: Dimension.  CT HEAD WITHOUT CONTRAST  Technique:  Contiguous axial images were obtained from the base of the skull through the vertex without contrast.  Comparison: None  Findings: No acute intracranial abnormality.  Specifically, no hemorrhage, hydrocephalus, mass lesion, acute infarction, or significant intracranial injury.  No acute calvarial abnormality. Minimal chronic small vessel disease in the deep white matter. Visualized paranasal sinuses and mastoids clear.  Orbital soft tissues unremarkable.  IMPRESSION: No acute intracranial abnormality.  Original Report Authenticated By: Cyndie Chime, M.D.   Ct Angio Chest W/cm &/or Wo Cm  04/11/2011  *RADIOLOGY REPORT*  Clinical Data:  Dementia, not eating, weakness, diabetes, hypertension, elevated D-dimer, past history left breast cancer post chemotherapy  CT ANGIOGRAPHY CHEST WITH CONTRAST  Technique:  Multidetector CT imaging of the chest was performed using the standard protocol during bolus administration of intravenous contrast.  Multiplanar CT image reconstructions including MIPs were obtained to evaluate the vascular anatomy.  Contrast: 80mL OMNIPAQUE IOHEXOL 350 MG/ML IV SOLN  Comparison:  None  Findings: Aorta normal caliber without aneurysm or dissection. Scattered atherosclerotic calcifications aorta and coronary arteries. Visualized portion of upper abdomen unremarkable. Left axillary adenopathy, enlarged node measuring 1.9 x 2.3 cm image 14. No additional thoracic adenopathy. Pulmonary arteries appear grossly patent. Axial images demonstrate a questionable filling defect in a right lower lobe pulmonary artery though on sagittal and coronal images this appears be due to either a  kink in the vessel. No definite evidence of pulmonary embolism. Dependent atelectasis in both lungs. 7 mm diameter nonspecific right lower lobe nodule image 47. Scattered end plate spur formation thoracic spine. No  definite acute bony lesions.  Review of the MIP images confirms the above findings.  IMPRESSION: No definite evidence of pulmonary embolism as above. Right lower lobe pulmonary nodule 7 mm diameter. Enlarged left axillary lymph node 2.3 x 1.9 cm. Metastatic disease not excluded in this patient with a history of breast cancer. Recommend comparison to prior outside exams to determine stability of these findings.  Original Report Authenticated By: Lollie Marrow, M.D.    Lab Results: Basic Metabolic Panel:  Basename 04/13/11 0643 04/12/11 0453  NA 136 135  K 3.5 3.5  CL 102 105  CO2 29 25  GLUCOSE 98 138*  BUN 13 15  CREATININE 1.13* 1.01  CALCIUM 10.4 10.1  MG -- --  PHOS -- --   Liver Function Tests:  [facility] 04/13/11 0643 04/11/11 1400  AST 16 25  ALT 7 9  ALKPHOS 73 88  BILITOT 0.6 0.9  PROT 5.9* 6.9  ALBUMIN 2.2* 2.6*     CBC:  Basename 04/13/11 0643 04/12/11 0453 04/11/11 1321  WBC 6.9 7.0 --  NEUTROABS -- -- 7.3  HGB 10.2* 10.2* --  HCT 31.1* 31.1* --  MCV 98.4 99.0 --  PLT 188 167 --    Recent Results (from the past 240 hour(s))  URINE CULTURE     Status: Normal   Collection Time   04/11/11  1:25 PM      Component Value Range Status Comment   Specimen Description URINE, CLEAN CATCH   Final    Special Requests NONE   Final    Setup Time [id number]   Final    Colony Count NO GROWTH   Final    Culture NO GROWTH   Final    Report Status 04/12/2011 FINAL   Final      Hospital Course: This 70 year old lady was admitted with history of poor by mouth intake in the last one week. She was found to have a UTI based on urinalysis. Culture is pending. She was started empirically on intravenous ciprofloxacin which was converted to oral ciprofloxacin. Her nausea and vomiting improved fairly quickly and she has tolerated a much better diet in terms of amount eaten in the last 24 hours. She also was found to have a 7 mm pulmonary nodule in the right lobe and left axillary  lymphadenopathy. This was found on CT chest scan. These findings will need followup. She is now seen oncologist for several years in view of stability regarding her breast cancer previously. She is agreeable to seeing an oncologist in [place] now.  Discharge Exam: Blood pressure 127/78, pulse 76, temperature 98.1 F (36.7 C), temperature source Oral, resp. rate 18, height 5\' 3"  (1.6 m), weight 72.031 kg (158 lb 12.8 oz), SpO2 98.00%. She looks systemically well. Heart sounds are present and normal. Lung fields are clear. Abdomen is soft nontender. She is alert and orientated. There are no focal neurological signs.  Disposition: Home. She will finish a further 5 day course of oral ciprofloxacin. Her primary care physician will need to followup her urine culture for clearance. I've told the family to call the oncologist in [place] for an appointment.  Discharge Orders    Future Orders Please Complete By Expires   Diet - low sodium heart healthy      Increase activity  slowly         Follow-up Information    Follow up with Randall An, MD. Call in 3 days. (You need to follow uo on lung nodule and lymph nodes seen on CT Chest Scan)    Contact information:   [address] [place] [address] [phone]          Signed: Wilson Singer Pager [phone]  04/13/2011, 10:59 AM

## 2011-04-15 ENCOUNTER — Institutional Professional Consult (permissible substitution): Payer: Self-pay | Admitting: Cardiology

## 2011-04-15 NOTE — Progress Notes (Signed)
Discharge summary sent to payer through [employer]  

## 2011-04-16 ENCOUNTER — Encounter (HOSPITAL_COMMUNITY): Payer: Self-pay

## 2011-04-16 ENCOUNTER — Other Ambulatory Visit: Payer: Self-pay

## 2011-04-16 ENCOUNTER — Emergency Department (HOSPITAL_COMMUNITY): Payer: Medicare Other

## 2011-04-16 ENCOUNTER — Inpatient Hospital Stay (HOSPITAL_COMMUNITY)
Admission: EM | Admit: 2011-04-16 | Discharge: 2011-04-21 | DRG: 641 | Disposition: A | Discharge status: Home / Self Care | Payer: Medicare Other | Source: Ambulatory Visit | Attending: Internal Medicine | Admitting: Internal Medicine

## 2011-04-16 DIAGNOSIS — Z853 Personal history of malignant neoplasm of breast: Secondary | ICD-10-CM

## 2011-04-16 DIAGNOSIS — Z7982 Long term (current) use of aspirin: Secondary | ICD-10-CM

## 2011-04-16 DIAGNOSIS — R599 Enlarged lymph nodes, unspecified: Secondary | ICD-10-CM | POA: Diagnosis present

## 2011-04-16 DIAGNOSIS — D649 Anemia, unspecified: Secondary | ICD-10-CM

## 2011-04-16 DIAGNOSIS — D638 Anemia in other chronic diseases classified elsewhere: Secondary | ICD-10-CM | POA: Diagnosis present

## 2011-04-16 DIAGNOSIS — Z01812 Encounter for preprocedural laboratory examination: Secondary | ICD-10-CM

## 2011-04-16 DIAGNOSIS — R911 Solitary pulmonary nodule: Secondary | ICD-10-CM | POA: Diagnosis present

## 2011-04-16 DIAGNOSIS — N390 Urinary tract infection, site not specified: Secondary | ICD-10-CM | POA: Diagnosis present

## 2011-04-16 DIAGNOSIS — R627 Adult failure to thrive: Principal | ICD-10-CM | POA: Diagnosis present

## 2011-04-16 DIAGNOSIS — E860 Dehydration: Secondary | ICD-10-CM

## 2011-04-16 DIAGNOSIS — Z880 Allergy status to penicillin: Secondary | ICD-10-CM

## 2011-04-16 DIAGNOSIS — E7800 Pure hypercholesterolemia, unspecified: Secondary | ICD-10-CM | POA: Diagnosis present

## 2011-04-16 DIAGNOSIS — E119 Type 2 diabetes mellitus without complications: Secondary | ICD-10-CM | POA: Diagnosis present

## 2011-04-16 DIAGNOSIS — F0390 Unspecified dementia without behavioral disturbance: Secondary | ICD-10-CM | POA: Diagnosis present

## 2011-04-16 DIAGNOSIS — E039 Hypothyroidism, unspecified: Secondary | ICD-10-CM | POA: Diagnosis present

## 2011-04-16 DIAGNOSIS — E876 Hypokalemia: Secondary | ICD-10-CM | POA: Diagnosis present

## 2011-04-16 DIAGNOSIS — D696 Thrombocytopenia, unspecified: Secondary | ICD-10-CM | POA: Diagnosis present

## 2011-04-16 DIAGNOSIS — I10 Essential (primary) hypertension: Secondary | ICD-10-CM | POA: Diagnosis present

## 2011-04-16 DIAGNOSIS — R6251 Failure to thrive (child): Secondary | ICD-10-CM

## 2011-04-16 DIAGNOSIS — Z79899 Other long term (current) drug therapy: Secondary | ICD-10-CM

## 2011-04-16 DIAGNOSIS — I498 Other specified cardiac arrhythmias: Secondary | ICD-10-CM | POA: Diagnosis present

## 2011-04-16 DIAGNOSIS — R531 Weakness: Secondary | ICD-10-CM

## 2011-04-16 LAB — CBC
Hemoglobin: 10.5 g/dL — ABNORMAL LOW (ref 12.0–15.0)
MCH: 31.1 pg (ref 26.0–34.0)
MCHC: 32.4 g/dL (ref 30.0–36.0)
MCHC: 32.1 g/dL (ref 30.0–36.0)
MCV: 97 fL (ref 78.0–100.0)
Platelets: 164 10*3/uL (ref 150–400)
Platelets: 147 10*3/uL — ABNORMAL LOW (ref 150–400)
RBC: 3.34 MIL/uL — ABNORMAL LOW (ref 3.87–5.11)

## 2011-04-16 LAB — BASIC METABOLIC PANEL
BUN: 21 mg/dL (ref 6–23)
Chloride: 104 mEq/L (ref 96–112)
GFR calc Af Amer: 54 mL/min — ABNORMAL LOW (ref >90)
GFR calc non Af Amer: 47 mL/min — ABNORMAL LOW (ref >90)
Potassium: 4.1 mEq/L (ref 3.5–5.1)
Sodium: 139 mEq/L (ref 135–145)

## 2011-04-16 LAB — DIFFERENTIAL
Basophils Absolute: 0 10*3/uL (ref 0.0–0.1)
Basophils Relative: 0 % (ref 0–1)
Monocytes Relative: 9 % (ref 3–12)
Neutro Abs: 2.9 10*3/uL (ref 1.7–7.7)
Neutrophils Relative %: 75 % (ref 43–77)

## 2011-04-16 LAB — CREATININE, SERUM: Creatinine, Ser: 1.21 mg/dL — ABNORMAL HIGH (ref 0.50–1.10)

## 2011-04-16 LAB — HEPATIC FUNCTION PANEL
ALT: 10 U/L (ref 0–35)
AST: 40 U/L — ABNORMAL HIGH (ref 0–37)
Albumin: 2.3 g/dL — ABNORMAL LOW (ref 3.5–5.2)
Total Protein: 6.7 g/dL (ref 6.0–8.3)

## 2011-04-16 LAB — TROPONIN I: Troponin I: <0.3 ng/mL (ref <0.30)

## 2011-04-16 MED ORDER — ONDANSETRON HCL 4 MG/2ML IJ SOLN
4.0000 mg | Freq: Four times a day (QID) | INTRAMUSCULAR | Status: DC | PRN
  Administered 2011-04-17: 4 mg via INTRAVENOUS
  Filled 2011-04-16: qty 2

## 2011-04-16 MED ORDER — DOCUSATE SODIUM 100 MG PO CAPS
100.0000 mg | ORAL_CAPSULE | Freq: Two times a day (BID) | ORAL | Status: DC
  Administered 2011-04-16 – 2011-04-21 (×10): 100 mg via ORAL
  Filled 2011-04-16 (×10): qty 1

## 2011-04-16 MED ORDER — ENOXAPARIN SODIUM 40 MG/0.4ML ~~LOC~~ SOLN
40.0000 mg | SUBCUTANEOUS | Status: DC
  Administered 2011-04-16 – 2011-04-19 (×4): 40 mg via SUBCUTANEOUS
  Filled 2011-04-16 (×5): qty 0.4

## 2011-04-16 MED ORDER — ALUM & MAG HYDROXIDE-SIMETH 200-200-20 MG/5ML PO SUSP: 30.0000 mL | Freq: Four times a day (QID) | ORAL | Status: DC | PRN

## 2011-04-16 MED ORDER — SODIUM CHLORIDE 0.9 % IV SOLN
INTRAVENOUS | Status: DC
  Administered 2011-04-16: 125 mL/h via INTRAVENOUS
  Administered 2011-04-17 – 2011-04-20 (×7): via INTRAVENOUS

## 2011-04-16 MED ORDER — AMLODIPINE BESYLATE 10 MG PO TABS
10.0000 mg | ORAL_TABLET | Freq: Every day | ORAL | Status: DC
  Administered 2011-04-16 – 2011-04-20 (×5): 10 mg via ORAL
  Filled 2011-04-16 (×6): qty 1

## 2011-04-16 MED ORDER — SODIUM CHLORIDE 0.9 % IV BOLUS (SEPSIS)
500.0000 mL | Freq: Once | INTRAVENOUS | Status: AC
  Administered 2011-04-16: 14:00:00 via INTRAVENOUS

## 2011-04-16 MED ORDER — FENOFIBRATE 160 MG PO TABS
160.0000 mg | ORAL_TABLET | ORAL | Status: DC
  Administered 2011-04-17 – 2011-04-21 (×5): 160 mg via ORAL
  Filled 2011-04-16 (×6): qty 1

## 2011-04-16 MED ORDER — DONEPEZIL HCL 10 MG PO TABS
10.0000 mg | ORAL_TABLET | Freq: Every day | ORAL | Status: DC
  Administered 2011-04-16 – 2011-04-20 (×5): 10 mg via ORAL
  Filled 2011-04-16 (×6): qty 1

## 2011-04-16 MED ORDER — ZOLPIDEM TARTRATE 5 MG PO TABS: 5.0000 mg | ORAL_TABLET | Freq: Every evening | ORAL | Status: DC | PRN

## 2011-04-16 MED ORDER — ACETAMINOPHEN 650 MG RE SUPP: 650.0000 mg | Freq: Four times a day (QID) | RECTAL | Status: DC | PRN

## 2011-04-16 MED ORDER — SIMVASTATIN 20 MG PO TABS
20.0000 mg | ORAL_TABLET | Freq: Every day | ORAL | Status: DC
  Administered 2011-04-16 – 2011-04-21 (×6): 20 mg via ORAL
  Filled 2011-04-16 (×6): qty 1

## 2011-04-16 MED ORDER — ASPIRIN EC 81 MG PO TBEC
81.0000 mg | DELAYED_RELEASE_TABLET | Freq: Every day | ORAL | Status: DC
  Administered 2011-04-16 – 2011-04-20 (×5): 81 mg via ORAL
  Filled 2011-04-16 (×5): qty 1

## 2011-04-16 MED ORDER — ONDANSETRON HCL 4 MG PO TABS: 4.0000 mg | ORAL_TABLET | Freq: Four times a day (QID) | ORAL | Status: DC | PRN

## 2011-04-16 MED ORDER — HYDROCODONE-ACETAMINOPHEN 5-325 MG PO TABS: 1.0000 | ORAL_TABLET | ORAL | Status: DC | PRN

## 2011-04-16 MED ORDER — CIPROFLOXACIN HCL 500 MG PO TABS
500.0000 mg | ORAL_TABLET | Freq: Two times a day (BID) | ORAL | Status: DC
  Administered 2011-04-16 – 2011-04-19 (×6): 500 mg via ORAL
  Filled 2011-04-16 (×8): qty 1

## 2011-04-16 MED ORDER — LEVOTHYROXINE SODIUM 75 MCG PO TABS
75.0000 ug | ORAL_TABLET | ORAL | Status: DC
  Administered 2011-04-17 – 2011-04-18 (×2): 75 ug via ORAL
  Filled 2011-04-16 (×3): qty 1

## 2011-04-16 MED ORDER — ACETAMINOPHEN 325 MG PO TABS: 650.0000 mg | ORAL_TABLET | Freq: Four times a day (QID) | ORAL | Status: DC | PRN

## 2011-04-16 NOTE — ED Notes (Signed)
[id number] READY

## 2011-04-16 NOTE — Progress Notes (Signed)
Long conversation with pt and family.  Family very concerned about pt staying in bed and not eating anything.  Husband cannot care for pt at this point, but son, [name], may be agreeable.  Pt is resistant to NHP, but she is very weak/deconditioned and might benefit from rehab in a NH or by the way of HHC.  Family questions whether/not pt's condition could be related to depression and they would be agreeable to getting psych involved.  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 Emotional support offered to pt/family. Service line CSW to follow.

## 2011-04-16 NOTE — ED Provider Notes (Signed)
History     CSN: [id number] Arrival date & time: 04/16/2011 11:21 AM   First MD Initiated Contact with Patient 04/16/11 1240      Chief Complaint  Patient presents with  . Weakness    (Consider location/radiation/quality/duration/timing/severity/associated sxs/prior treatment) HPI Comments: The patient is a 70 year old female with a history of dementia who presents brought in by her family for failure to thrive. The patient was recently admitted to the hospital and diagnosed with urinary tract infection after presentation to the ED approximately one week ago with similar symptoms. Since discharge home 5 days ago, the patient's family states the patient has not eaten anything and barely drinks anything and is not getting up and walking or carrying for herself. The patient denies any and all symptoms other than lack of appetite and generalized weakness. She denies headache, dizziness, chest pain, shortness of breath, nausea, vomiting, abdominal pain, focal numbness or weakness.  Patient is a 70 y.o. female presenting with weakness. The history is provided by the patient, a caregiver and a relative.  Weakness Primary symptoms do not include headaches, syncope, loss of consciousness, altered mental status, seizures, dizziness, visual change, paresthesias, focal weakness, loss of sensation, speech change, memory loss, fever, nausea or vomiting. The symptoms began more than 1 week ago. The episode lasted 21 days. The symptoms are worsening. The neurological symptoms are diffuse. Context: The patient was recently admitted to the hospital and discharged approximately one week ago for the same symptoms of not eating, not drinking, generalized weakness and decreased self-care. Since discharge from the hospital the symptoms have gotten no bette.  Additional symptoms include weakness. Additional symptoms do not include neck stiffness, pain, lower back pain, leg pain, loss of balance, photophobia,  hallucinations, nystagmus, hearing loss, anxiety or irritability. Associated medical issues comments: Dementia, recently diagnosed urinary tract infection. Procedure history comments: Admission and hospitalization within the last week..    Past Medical History  Diagnosis Date  . Dementia   . Thyroid disease   . Diabetes mellitus   . Hypertension   . Hyperlipidemia   . Breast cancer     History reviewed. No pertinent past surgical history.  Family History  Problem Relation Age of Onset  . Diabetes Mother   . Coronary artery disease Mother   . Coronary artery disease Father     History  Substance Use Topics  . Smoking status: Not on file  . Smokeless tobacco: Not on file  . Alcohol Use: No    OB History    Grav Para Term Preterm Abortions TAB SAB Ect Mult Living                  Review of Systems  Constitutional: Positive for fatigue. Negative for fever, chills, appetite change and irritability.  HENT: Negative for hearing loss, ear pain, congestion, sore throat, rhinorrhea, drooling, trouble swallowing, neck pain, neck stiffness, voice change and postnasal drip.   Eyes: Negative for photophobia and visual disturbance.  Respiratory: Negative for cough, chest tightness, shortness of breath and wheezing.   Cardiovascular: Negative for chest pain, palpitations, leg swelling and syncope.  Gastrointestinal: Negative for nausea, vomiting, abdominal pain, diarrhea, constipation and abdominal distention.  Genitourinary: Negative.  Negative for dysuria and difficulty urinating.  Musculoskeletal: Positive for gait problem. Negative for myalgias, back pain, joint swelling and arthralgias.  Skin: Negative for color change, pallor, rash and wound.  Neurological: Positive for weakness. Negative for dizziness, tremors, speech change, focal weakness, seizures, loss of consciousness, syncope,  facial asymmetry, speech difficulty, light-headedness, numbness, headaches, paresthesias and loss  of balance.  Hematological: Does not bruise/bleed easily.  Psychiatric/Behavioral: Positive for confusion. Negative for hallucinations, memory loss, behavioral problems, agitation and altered mental status.    Allergies  Penicillins  Home Medications   Current Outpatient Rx  Name Route Sig Dispense Refill  . AMLODIPINE BESYLATE 10 MG PO TABS Oral Take 10 mg by mouth at bedtime.     . ATORVASTATIN CALCIUM 20 MG PO TABS Oral Take 20 mg by mouth at bedtime.     Marland Kitchen CIPROFLOXACIN HCL 500 MG PO TABS Oral Take 1 tablet (500 mg total) by mouth 2 (two) times daily. 10 tablet 0  . DONEPEZIL HCL 10 MG PO TABS Oral Take 10 mg by mouth at bedtime.     . FENOFIBRATE 160 MG PO TABS Oral Take 160 mg by mouth every morning.     Marland Kitchen LEVOTHYROXINE SODIUM 75 MCG PO TABS Oral Take 75 mcg by mouth every morning.     Marland Kitchen LOVASTATIN 40 MG PO TABS Oral Take 80 mg by mouth at bedtime. 2 tablets at hs      BP 124/60  Pulse 106  Temp(Src) 99.8 F (37.7 C) (Oral)  Resp 18  SpO2 99%  Physical Exam  Nursing note and vitals reviewed. Constitutional: She is oriented to person, place, and time. She appears well-nourished. No distress.       The patient appears in no significant discomfort, awake, alert, and oriented to person only. She denies any pain or injury. There are no apparent injuries on examination.  HENT:  Head: Normocephalic and atraumatic. Head is without raccoon's eyes, without Battle's sign, without abrasion, without contusion, without laceration, without right periorbital erythema and without left periorbital erythema. No trismus in the jaw.  Right Ear: Tympanic membrane, external ear and ear canal normal. No drainage or tenderness. No mastoid tenderness. Tympanic membrane is not perforated. No hemotympanum.  Left Ear: Tympanic membrane, external ear and ear canal normal. No drainage or tenderness. No mastoid tenderness. Tympanic membrane is not perforated. No hemotympanum.  Nose: Nose normal. No mucosal  edema, rhinorrhea, nose lacerations, sinus tenderness, nasal deformity, septal deviation or nasal septal hematoma. No epistaxis. Right sinus exhibits no maxillary sinus tenderness and no frontal sinus tenderness. Left sinus exhibits no maxillary sinus tenderness and no frontal sinus tenderness.  Mouth/Throat: Uvula is midline, oropharynx is clear and moist and mucous membranes are normal. No lacerations.  Eyes: Conjunctivae, EOM and lids are normal. Pupils are equal, round, and reactive to light. Right eye exhibits no chemosis and no discharge. Left eye exhibits no chemosis and no discharge. Right conjunctiva is not injected. Right conjunctiva has no hemorrhage. Left conjunctiva is not injected. Left conjunctiva has no hemorrhage. Right eye exhibits normal extraocular motion. Left eye exhibits normal extraocular motion.  Neck: Trachea normal, normal range of motion and phonation normal. Neck supple. No JVD present. No tracheal tenderness, no spinous process tenderness and no muscular tenderness present. No tracheal deviation present.  Cardiovascular: Normal rate, regular rhythm, S1 normal, S2 normal, normal heart sounds and intact distal pulses.  Exam reveals no gallop, no distant heart sounds and no friction rub.   No murmur heard. Pulmonary/Chest: Effort normal and breath sounds normal. No accessory muscle usage or stridor. Not tachypneic. No respiratory distress. She has no decreased breath sounds. She has no wheezes. She has no rales. She exhibits no tenderness, no bony tenderness, no crepitus, no deformity and no retraction.  Abdominal:  Soft. Normal appearance and bowel sounds are normal. She exhibits no distension, no pulsatile midline mass and no mass. There is no splenomegaly or hepatomegaly. There is no tenderness. There is no rigidity, no rebound, no guarding and no CVA tenderness.  Musculoskeletal: Normal range of motion. She exhibits no edema and no tenderness.  Neurological: She is alert and  oriented to person, place, and time. She is not disoriented. No cranial nerve deficit or sensory deficit. She exhibits normal muscle tone. GCS eye subscore is 4. GCS verbal subscore is 5. GCS motor subscore is 6.  Skin: Skin is warm and dry. Abrasion and bruising noted. No rash noted. She is not diaphoretic. No erythema. No pallor.  Psychiatric: She has a normal mood and affect. Her speech is normal and behavior is normal.    ED Course  Procedures (including critical care time)  Date: 04/16/2011  Rate: 109   Rhythm: sinus tachycardia  QRS Axis: left  Intervals: QRS is wide and in a left bundle branch block and QT is prolonged  ST/T Wave abnormalities: indeterminate  Conduction Disutrbances:left bundle branch block  Narrative Interpretation: Sinus tachycardia, otherwise when reviewed with previous EKG, this is a non-provocative EKG study.  Old EKG Reviewed: unchanged   Labs Reviewed - No data to display No results found.   No diagnosis found.    MDM  Infection, anemia, electrolyte abnormality, myocardial infarction, arrhythmia, advancing dementia are all entertained among other potential etiologies of the patient's recent functional decline. As the patient has not thrived at home after her recent discharge I anticipate that she will need admission for evaluation and placement if no acute cause of her functional decline is found.        Felisa Bonier, MD 04/16/11 [phone]

## 2011-04-16 NOTE — H&P (Signed)
PATIENT DETAILS Name: Kristen Wheeler Age: 70 y.o. Sex: female Date of Birth: [birth year] Admit Date: 04/16/2011 QIO:NGEX,BMWUXLK PATRICK, MD, MD   CHIEF COMPLAINT:  Not eating for 3 weeks  HPI: The patient is a 70 year old female with a history of dementia who presents brought in by her family for failure to thrive. The patient was recently admitted to the hospital and diagnosed with urinary tract infection after presentation to the ED approximately one week ago with similar symptoms. Since discharge home 5 days ago, the patient's family states the patient has not eaten anything and barely drinks anything and is not getting up and walking or carrying for herself. The patient denies any and all symptoms other than lack of appetite and generalized weakness. She denies headache, dizziness, chest pain, shortness of breath, nausea, vomiting, abdominal pain, focal numbness or weakness.   ALLERGIES:   Allergies  Allergen Reactions  . Penicillins     Advised was allergic to penicillin early in life.    PAST MEDICAL HISTORY: Past Medical History  Diagnosis Date  . Dementia   . Thyroid disease   . Diabetes mellitus   . Hypertension   . Hyperlipidemia   . Breast cancer     PAST SURGICAL HISTORY: History reviewed. No pertinent past surgical history.  MEDICATIONS AT HOME: Prior to Admission medications   Medication Sig Start Date End Date Taking? Authorizing Provider  amLODipine (NORVASC) 10 MG tablet Take 10 mg by mouth at bedtime.    Yes Historical Provider, MD  atorvastatin (LIPITOR) 20 MG tablet Take 20 mg by mouth at bedtime.    Yes Historical Provider, MD  ciprofloxacin (CIPRO) 500 MG tablet Take 1 tablet (500 mg total) by mouth 2 (two) times daily. 04/13/11 04/23/11 Yes Nimish C Gosrani  donepezil (ARICEPT) 10 MG tablet Take 10 mg by mouth at bedtime.    Yes Historical Provider, MD  fenofibrate 160 MG tablet Take 160 mg by mouth every morning.    Yes Historical Provider, MD    levothyroxine (SYNTHROID, LEVOTHROID) 75 MCG tablet Take 75 mcg by mouth every morning.    Yes Historical Provider, MD  lovastatin (MEVACOR) 40 MG tablet Take 80 mg by mouth at bedtime. 2 tablets at hs   Yes Historical Provider, MD    FAMILY HISTORY: Family History  Problem Relation Age of Onset  . Diabetes Mother   . Coronary artery disease Mother   . Coronary artery disease Father     SOCIAL HISTORY:  does not have a smoking history on file. She does not have any smokeless tobacco history on file. She reports that she does not drink alcohol or use illicit drugs.patient lives with her husband and there is one son who visits regularly  REVIEW OF SYSTEMS:  Constitutional:   No  weight loss, night sweats,  Fevers, chills, fatigue.  HEENT:    No headaches, Difficulty swallowing,Tooth/dental problems,Sore throat,  No sneezing, itching, ear ache, nasal congestion, post nasal drip,   Cardio-vascular: No chest pain,  Orthopnea, PND, swelling in lower extremities, anasarca,         dizziness, palpitations  GI:  No heartburn, indigestion, abdominal pain, nausea, vomiting, diarrhea, change in       bowel habits, loss of appetite  Resp: No shortness of breath with exertion or at rest.  No excess mucus, no productive cough, No non-productive cough,  No coughing up of blood.No change in color of mucus.No wheezing.No chest wall deformity  Skin:  no rash or lesions.  GU:  no dysuria, change in color of urine, no urgency or frequency.  No flank pain.  Musculoskeletal: No joint pain or swelling.  No decreased range of motion.  No back pain.  Psych: No change in mood or affect. No depression or anxiety.  No memory loss.   PHYSICAL EXAM: Blood pressure 108/71, pulse 117, temperature 99.8 F (37.7 C), temperature source Oral, resp. rate 16, SpO2 100.00%.  General appearance :Awake, alert, not in any distress. Speech Clear. Not toxic Looking disoriented to time and place HEENT:  Atraumatic and Normocephalic, pupils equally reactive to light and accomodation Neck: supple, no JVD. No cervical lymphadenopathy.  Chest:Good air entry bilaterally, no added sounds  CVS: S1 S2 regular, no murmurs.  Abdomen: Bowel sounds present, Non tender and not distended with no gaurding, rigidity or rebound. Extremities: B/L Lower Ext shows no edema, both legs are warm to touch, with  dorsalis pedis pulses palpable. Neurology: Awake alert, and oriented X 3, CN II-XII intact, Non focal, Deep Tendon Reflex-2+ all over, plantar's downgoing B/L, sensory exam is grossly intact.  Skin:No Rash Wounds:N/A  LABS ON ADMISSION:   Basename 04/16/11 1331  NA 139  K 4.1  CL 104  CO2 28  GLUCOSE 91  BUN 21  CREATININE 1.16*  CALCIUM 10.1  MG --  PHOS --    Basename 04/16/11 1331  AST 40*  ALT 10  ALKPHOS 87  BILITOT 0.5  PROT 6.7  ALBUMIN 2.3*   No results found for this basename: LIPASE:2,AMYLASE:2 in the last 72 hours  Basename 04/16/11 1331  WBC 3.9*  NEUTROABS 2.9  HGB 10.5*  HCT 32.4*  MCV 97.0  PLT 164    Basename 04/16/11 1331  CKTOTAL --  CKMB --  CKMBINDEX --  TROPONINI <0.30   No results found for this basename: DDIMER:2 in the last 72 hours No components found with this basename: POCBNP:3   RADIOLOGIC STUDIES ON ADMISSION: Dg Chest 2 View  04/16/2011  *RADIOLOGY REPORT*  Clinical Data: Weakness.  CHEST - 2 VIEW  Comparison: 04/11/2011  Findings: Scattered parenchymal scarring and atelectasis bilaterally.  No evidence of overt edema, pulmonary consolidation or pleural effusion.  Stable cardiomegaly.  Stable degenerative disease of the thoracic spine.  IMPRESSION: No active disease.  Scattered scarring and atelectasis present in both lungs.  Original Report Authenticated By: Reola Calkins, M.D.   Dg Chest 2 View  04/11/2011  *RADIOLOGY REPORT*  Clinical Data: Dimension, breast cancer, anorexia  CHEST - 2 VIEW  Comparison: None.  Findings: The cardiac  silhouette, mediastinum, pulmonary vasculature are within normal limits.  Both lungs are clear. There is no acute bony abnormality.  IMPRESSION: There is no evidence of acute cardiac or pulmonary process.  Original Report Authenticated By: Brandon Melnick, M.D.   Ct Head Wo Contrast  04/11/2011  *RADIOLOGY REPORT*  Clinical Data: Dimension.  CT HEAD WITHOUT CONTRAST  Technique:  Contiguous axial images were obtained from the base of the skull through the vertex without contrast.  Comparison: None  Findings: No acute intracranial abnormality.  Specifically, no hemorrhage, hydrocephalus, mass lesion, acute infarction, or significant intracranial injury.  No acute calvarial abnormality. Minimal chronic small vessel disease in the deep white matter. Visualized paranasal sinuses and mastoids clear.  Orbital soft tissues unremarkable.  IMPRESSION: No acute intracranial abnormality.  Original Report Authenticated By: Cyndie Chime, M.D.   Ct Angio Chest W/cm &/or Wo Cm  04/11/2011  *RADIOLOGY REPORT*  Clinical Data:  Dementia, not  eating, weakness, diabetes, hypertension, elevated D-dimer, past history left breast cancer post chemotherapy  CT ANGIOGRAPHY CHEST WITH CONTRAST  Technique:  Multidetector CT imaging of the chest was performed using the standard protocol during bolus administration of intravenous contrast.  Multiplanar CT image reconstructions including MIPs were obtained to evaluate the vascular anatomy.  Contrast: 80mL OMNIPAQUE IOHEXOL 350 MG/ML IV SOLN  Comparison:  None  Findings: Aorta normal caliber without aneurysm or dissection. Scattered atherosclerotic calcifications aorta and coronary arteries. Visualized portion of upper abdomen unremarkable. Left axillary adenopathy, enlarged node measuring 1.9 x 2.3 cm image 14. No additional thoracic adenopathy. Pulmonary arteries appear grossly patent. Axial images demonstrate a questionable filling defect in a right lower lobe pulmonary artery though on  sagittal and coronal images this appears be due to either a kink in the vessel. No definite evidence of pulmonary embolism. Dependent atelectasis in both lungs. 7 mm diameter nonspecific right lower lobe nodule image 47. Scattered end plate spur formation thoracic spine. No definite acute bony lesions.  Review of the MIP images confirms the above findings.  IMPRESSION: No definite evidence of pulmonary embolism as above. Right lower lobe pulmonary nodule 7 mm diameter. Enlarged left axillary lymph node 2.3 x 1.9 cm. Metastatic disease not excluded in this patient with a history of breast cancer. Recommend comparison to prior outside exams to determine stability of these findings.  Original Report Authenticated By: Lollie Marrow, M.D.    ASSESSMENT AND PLAN: Present on Admission:  Failure to thrive Dementia Recent UTI Anemia Hypothyroidism Hypertension Hypercholesterolemia ? Diabetes mellitus  Admit patient to MedSurg Continue treatment with ciprofloxacin until dc Consultation to social worker Continue home medications    Further plan will depend as patient's clinical course evolves and further radiologic and laboratory data become available. Patient will be monitored closely.   DVT Prophylaxis: Lovenox  Code Status: Full  Total time spent for admission equals 45 minutes.  Jonny Ruiz 04/16/2011, 7:36 PM

## 2011-04-16 NOTE — ED Notes (Signed)
Per ems- pt c/o generalized weakness x3wks. Pt denies preceding incident. Pt can ambulate with assistance. NAD noted at this time.

## 2011-04-17 ENCOUNTER — Encounter (HOSPITAL_COMMUNITY): Payer: Self-pay | Admitting: *Deleted

## 2011-04-17 DIAGNOSIS — R634 Abnormal weight loss: Secondary | ICD-10-CM

## 2011-04-17 DIAGNOSIS — F0390 Unspecified dementia without behavioral disturbance: Secondary | ICD-10-CM

## 2011-04-17 DIAGNOSIS — D509 Iron deficiency anemia, unspecified: Secondary | ICD-10-CM

## 2011-04-17 DIAGNOSIS — C50919 Malignant neoplasm of unspecified site of unspecified female breast: Secondary | ICD-10-CM

## 2011-04-17 LAB — IRON AND TIBC
Iron: 21 ug/dL — ABNORMAL LOW (ref 42–135)
Saturation Ratios: 11 % — ABNORMAL LOW (ref 20–55)
TIBC: 188 ug/dL — ABNORMAL LOW (ref 250–470)
UIBC: 167 ug/dL (ref 125–400)

## 2011-04-17 LAB — CBC
HCT: 30.2 % — ABNORMAL LOW (ref 36.0–46.0)
MCH: 31.2 pg (ref 26.0–34.0)
MCV: 97.1 fL (ref 78.0–100.0)
Platelets: 144 10*3/uL — ABNORMAL LOW (ref 150–400)
RBC: 3.11 MIL/uL — ABNORMAL LOW (ref 3.87–5.11)
RDW: 13.4 % (ref 11.5–15.5)

## 2011-04-17 LAB — CARDIAC PANEL(CRET KIN+CKTOT+MB+TROPI)
CK, MB: 2 ng/mL (ref 0.3–4.0)
Relative Index: INVALID (ref 0.0–2.5)
Relative Index: INVALID (ref 0.0–2.5)
Troponin I: <0.3 ng/mL (ref <0.30)
Troponin I: <0.3 ng/mL (ref <0.30)

## 2011-04-17 LAB — RETICULOCYTES: Retic Count, Absolute: 39.6 10*3/uL (ref 19.0–186.0)

## 2011-04-17 LAB — FOLATE: Folate: 7.9 ng/mL

## 2011-04-17 LAB — T4, FREE: Free T4: 0.72 ng/dL — ABNORMAL LOW (ref 0.80–1.80)

## 2011-04-17 LAB — FERRITIN: Ferritin: 1515 ng/mL — ABNORMAL HIGH (ref 10–291)

## 2011-04-17 MED ORDER — ENSURE IMMUNE HEALTH PO LIQD
237.0000 mL | Freq: Two times a day (BID) | ORAL | Status: DC
  Administered 2011-04-17: 19:00:00 via ORAL
  Administered 2011-04-17 – 2011-04-21 (×6): 237 mL via ORAL

## 2011-04-17 MED ORDER — ENSURE PUDDING PO PUDG
1.0000 | Freq: Three times a day (TID) | ORAL | Status: DC
  Administered 2011-04-17 – 2011-04-21 (×6): 1 via ORAL

## 2011-04-17 MED ORDER — ADULT MULTIVITAMIN W/MINERALS CH
1.0000 | ORAL_TABLET | Freq: Every day | ORAL | Status: DC
  Administered 2011-04-17 – 2011-04-21 (×5): 1 via ORAL
  Filled 2011-04-17 (×5): qty 1

## 2011-04-17 NOTE — Progress Notes (Signed)
   CARE MANAGEMENT NOTE 04/17/2011  Patient:  Kristen Wheeler, Kristen Wheeler   Account Number:  [id number]  Date Initiated:  04/17/2011  Documentation initiated by:  Donn Pierini  Subjective/Objective Assessment:   Pt admitted with FTT, anemia, weakness     Action/Plan:   PTA pt lived at home- family concerned about depression- PT/OT evals ordered   Anticipated DC Date:  04/19/2011   Anticipated DC Plan:  HOME W HOME HEALTH SERVICES  In-house referral  Clinical Social Worker      DC Planning Services  CM consult      Choice offered to / List presented to:             Status of service:  In process, will continue to follow Medicare Important Message given?   (If response is "NO", the following Medicare IM given date fields will be blank) Date Medicare IM given:   Date Additional Medicare IM given:    Discharge Disposition:    Per UR Regulation:    Comments:  PCP- Wong  04/17/11- 1500- Donn Pierini RN, BSN [phone] PT/OT evals pending, CM to follow for potential d/c needs.

## 2011-04-17 NOTE — Progress Notes (Signed)
Utilization review complete 

## 2011-04-17 NOTE — Progress Notes (Signed)
INITIAL ADULT NUTRITION ASSESSMENT Date: 04/17/2011   Time: 3:11 PM  Reason for Assessment: Failure to Thrive  ASSESSMENT: Female 70 y.o.  Dx: UTI, dehydration, failure to thrive, anemia  Hx:  Past Medical History  Diagnosis Date  . Dementia   . Thyroid disease   . Diabetes mellitus   . Hypertension   . Hyperlipidemia   . Breast cancer     Related Meds: cipro, colace, lovenox, synthroid, zocor  Ht: 5\' 2"  (157.5 cm)  Wt:  159# (72kg)  04/13/11  Ideal Wt: 50.1 kg  % Ideal Wt: 144  Usual Wt: 180# % Usual Wt: 88  BMI=29  Food/Nutrition Related Hx: Pt has not eaten much for >1 week.  Pt is unable to answer questions well or give much history.  Lost job 2 months ago with decrease in status since.  Labs:  CMP     Component Value Date/Time   NA 139 04/16/2011 1331   K 4.1 04/16/2011 1331   CL 104 04/16/2011 1331   CO2 28 04/16/2011 1331   GLUCOSE 91 04/16/2011 1331   BUN 21 04/16/2011 1331   CREATININE 1.21* 04/16/2011 2017   CALCIUM 10.1 04/16/2011 1331   PROT 6.7 04/16/2011 1331   ALBUMIN 2.3* 04/16/2011 1331   AST 40* 04/16/2011 1331   ALT 10 04/16/2011 1331   ALKPHOS 87 04/16/2011 1331   BILITOT 0.5 04/16/2011 1331   GFRNONAA 44* 04/16/2011 2017   GFRAA 51* 04/16/2011 2017     Intake: I/O last 3 completed shifts: In: 1718.3 [P.O.:60; I.V.:1658.3] Out: -  Total I/O In: 120 [P.O.:120] Out: 350 [Urine:350]   Diet Order: Regular with ensure pudding tid  Supplements/Tube Feeding:none  IVF:    sodium chloride Last Rate: 100 mL/hr at 04/17/11 1507    Estimated Nutritional Needs:  Per day   Kcal: 1400-1500 Protein: 60-70 Fluid: >1.4L  Pt reports eating meals well but this information was denied by roommates visitor.  Noted work up for malignancy.  Inadequate intake for past 3 weeks per family with 10# weight loss in past month.  Pt meets criteria for Severe Social Malnutrition as evidenced by 7% weight loss in past month and <50%  intake.  NUTRITION DIAGNOSIS: -Inadequate oral intake (NI-2.1).  Status: Ongoing  RELATED TO: poor appetite  AS EVIDENCE BY: reported poor intake   MONITORING/EVALUATION(Goals): Meet needs with meals and supplements  EDUCATION NEEDS: -No education needs identified at this time  INTERVENTION: 1.  Continue Ensure Pudding 2.  Add Ensure bid 3.  Add MVI 4.  Encouraged po  Dietitian [phone]  DOCUMENTATION CODES Per approved criteria  -Severe  malnutrition in the context of social or environmental circumstances    , Anastasia Fiedler 04/17/2011, 3:11 PM

## 2011-04-17 NOTE — Progress Notes (Signed)
Subjective: Patient relates decrease appetite. Does not want to eat. Per son she has complain of abdominal pain.  Objective: Filed Vitals:   04/16/11 2030 04/16/11 2345 04/17/11 0200 04/17/11 0600  BP: 135/66 134/53 124/55 136/57  Pulse: 110 82 80 86  Temp: 99.4 F (37.4 C) 99.1 F (37.3 C) 98.8 F (37.1 C) 98.4 F (36.9 C)  TempSrc: Oral     Resp: 20 18 18 20   Height:   5\' 2"  (1.575 m)   SpO2: 100% 95% 95% 96%   Weight change:   Intake/Output Summary (Last 24 hours) at 04/17/11 1156 Last data filed at 04/17/11 0500  Gross per 24 hour  Intake 1718.33 ml  Output      0 ml  Net 1718.33 ml    General: Alert, awake, oriented x3, in no acute distress.  HEENT: No bruits, no goiter.  Heart: Regular rate and rhythm, without murmurs, rubs, gallops.  Lungs: Crackles left side, bilateral air movement.  Abdomen: Soft, nontender, nondistended, positive bowel sounds.  Neuro: Grossly intact, nonfocal. Extremities: no edema.   Lab Results:  [facility] 04/16/11 2017 04/16/11 1331  NA -- 139  K -- 4.1  CL -- 104  CO2 -- 28  GLUCOSE -- 91  BUN -- 21  CREATININE 1.21* 1.16*  CALCIUM -- 10.1  MG -- --  PHOS -- --    Basename 04/16/11 1331  AST 40*  ALT 10  ALKPHOS 87  BILITOT 0.5  PROT 6.7  ALBUMIN 2.3*    Basename 04/17/11 0535 04/16/11 2017 04/16/11 1331  WBC 3.6* 4.1 --  NEUTROABS -- -- 2.9  HGB 9.7* 10.2* --  HCT 30.2* 31.8* --  MCV 97.1 97.0 --  PLT 144* 147* --    Basename 04/17/11 0043 04/16/11 1331  CKTOTAL 54 --  CKMB 1.5 --  CKMBINDEX -- --  TROPONINI <0.30 <0.30    Micro Results: Recent Results (from the past 240 hour(s))  URINE CULTURE     Status: Normal   Collection Time   04/11/11  1:25 PM      Component Value Range Status Comment   Specimen Description URINE, CLEAN CATCH   Final    Special Requests NONE   Final    Setup Time [id number]   Final    Colony Count NO GROWTH   Final    Culture NO GROWTH   Final    Report Status 04/12/2011  FINAL   Final     Studies/Results: Dg Chest 2 View  04/16/2011  *RADIOLOGY REPORT*  Clinical Data: Weakness.  CHEST - 2 VIEW  Comparison: 04/11/2011  Findings: Scattered parenchymal scarring and atelectasis bilaterally.  No evidence of overt edema, pulmonary consolidation or pleural effusion.  Stable cardiomegaly.  Stable degenerative disease of the thoracic spine.  IMPRESSION: No active disease.  Scattered scarring and atelectasis present in both lungs.  Original Report Authenticated By: Reola Calkins, M.D.    Medications: I have reviewed the patient's current medications.   Patient Active Hospital Problem List:  Failure to thrive  Concern for malignancy. Patient with history of breast cancer, has left axillary lymphadenopathy. I will consult oncology.  I will check TSH, free T3 and T4.  PT , OT consult.  / Abdominal pain, will defer to oncology further imaging. Ct abdomen, PET scan,???   Dementia  Continue with donepezil.   Recent UTI  Repeat UA, continue with ciprofloxacin for now.   Anemia :  I will check anemia panel.   Hypothyroidism  Continue with synthroid. Check Free T3 and T4.  Hypertension  Diabetes mellitus; Monitor CBG. Left axilary lymphadenopathy, Lung nodule. : Oncology consulted.  Appreciated Dr Twanna Hy help.    LOS: 1 day   REGALADO,BELKYS M.D.  [facility] 04/17/2011, 11:56 AM

## 2011-04-17 NOTE — Progress Notes (Signed)
Patient had room full of visitors so I did not get to speak with her today- please see CSW Psyhcosocial Assessment in shadow chart from 04-16-11.  Will plan to see patient tomorrow to further assess and provide support. Discussed with Dr. Sunnie Nielsen who is planning further work up as well. Reece Levy, MSW, Theresia Majors [phone]

## 2011-04-17 NOTE — Progress Notes (Signed)
2345 Pt c/o chest pain mid sternum not radiating. Pt vss T 99.1 P 82 B/P 134/53 O2 sat 95% 2l/m Sh[place]. 2355 MD page. 0005 Pt denies chest pain now. Maren Reamer NP return page and was told about pt c/o of chest pain but now is pain free, labs ordered. Pt is resting at present will continue to monitor.

## 2011-04-17 NOTE — Consult Note (Signed)
Reason for consult:R/O Breast cancer recurrence Consulting MD: Dr. Myna Hidalgo Date of Consultation: 04/17/2011 PCP Dr.  Modesto Charon, F. 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 Kathrynn Running, in [place], [place]),  admitted initially 12/13 with on week history of anorexia with a 10 lb wt loss over the last 2-3 weeks, intermittent abdominal discomfort, chest wall pain and overall weakness. She was found to have UTI. In addition, other tests were taken including a  CT head on 12/13 which  was negative. CT angio of the chest w contrast 12/13 was performed after her DDmer was elevated. This revealed a Left axillary adenopathy with enlarged node measuring 1.9 x 2.3 cm,7 mm diameter nonspecific right lower lobe nodule,and No definite acute bony lesions. She was eventually d/c'd on 12/15 with Cipro and was to f/u in [place] (Dr. Mariel Sleet) for further workup regarding her new found masses. However, she was readmitted on 12/18 with worsening of her symptoms, unable to ambulate without assistance. We were requested to see patient while in hospital.  A PET scan was planned during the hospitalization, but as of today, has not been performed for review. Marland Kitchen    PMH: Past Medical History  Diagnosis Date  . Dementia, mild to moderate   . Hypothyroidism   . Diabetes mellitus   . Hypertension   . Hyperlipidemia   . Breast cancer (awaiting records)     Surgeries:  sp right axillary sentinel lymph node and s/p  Right partial mastectomy with needle localization and specimen mammography, Dr Young,01/29/2002   Allergies:  Allergies  Allergen Reactions  . Penicillins     Advised was allergic to penicillin early in  life.    Medications:   Scheduled:   . amLODipine  10 mg Oral QHS  . aspirin EC  81 mg Oral Daily  . ciprofloxacin  500 mg Oral BID  . docusate sodium  100 mg Oral BID  . donepezil  10 mg Oral QHS  . enoxaparin  40 mg Subcutaneous Q24H  . feeding supplement  1 Container Oral TID BM  . fenofibrate  160 mg Oral Q0700  . levothyroxine  75 mcg Oral Q0700  . simvastatin  20 mg Oral Daily  . sodium chloride  500 mL Intravenous Once   ZOX:[id number], acetaminophen, alum & mag hydroxide-simeth, HYDROcodone-acetaminophen, ondansetron (ZOFRAN) IV, ondansetron, zolpidem  JYN:WGNFAO of Systems  Constitutional: Positive for weight loss of 80 lbs in the last year, 10 of those in the last 2 weeks. Negative for fever, chills and positive for  malaise/fatigue.  Eyes: Negative for blurred vision and double vision.  Respiratory: Positive for cough,  No hemoptysis. Mild shortness of breath.  Cardiovascular: mid sternal chest pain GI: No nausea, vomiting, diarrhea, constipation. No change in bowel caliber. No  Melena or  Hematochezia. Decreased appetite as above GU: No blood in urine. No loss of urinary control. Skin: Negative for itching. No rash. No petechia. No bruising Neurological: Chronic headaches. No motor or sensory deficits.  Family History: Family History  Problem Relation Age of Onset  . Diabetes Mother   . Coronary artery disease Mother   . Coronary artery disease Father     Social History:  reports that  she has never smoked. She does not have any smokeless tobacco history on file. She reports that she does not drink alcohol or use illicit drugs.  Physical Exam  70 y.o. female in no acute distress  A. and O. x3 HEENT: Normocephalic, atraumatic, PERRLA. Oral cavity without thrush or lesions. Neck: supple. no thyromegaly, no cervical or supraclavicular adenopathy  Lungs: clear bilaterally . No wheezing, rhonchi or rales. No axillary masses. Breasts: No discrete masses. Cannot  palpate L axillary lymph node described in film. The area however is tender to palpation Cardiac: regular rate and rhythm normal S1-S2, 2-3/6 systolic murmur , no  rubs or gallops Abdomen: soft nontender , bowel sounds x4  no hepatosplenomegaly GU/rectal: deferred. Extremities: no clubbing cyanosis . No edema.No bruising or petechial rash Neurologic: A. and O. x3, no focal deficits Skin: multiple areas of moles and freckles around the trunk and arms      Labs  CBC  Lab 04/17/11 0535 04/16/11 2017 04/16/11 1331 04/13/11 0643 04/12/11 0453 04/11/11 1321  WBC 3.6* 4.1 3.9* 6.9 7.0 --  HGB 9.7* 10.2* 10.5* 10.2* 10.2* --  HCT 30.2* 31.8* 32.4* 31.1* 31.1* --  PLT 144* 147* 164 188 167 --  MCV 97.1 97.0 97.0 98.4 99.0 --  MCH 31.2 31.1 31.4 32.3 32.5 --  MCHC 32.1 32.1 32.4 32.8 32.8 --  RDW 13.4 13.1 13.2 13.1 13.4 --  LYMPHSABS -- -- 0.6* -- -- 1.1  MONOABS -- -- 0.4 -- -- 0.9  EOSABS -- -- 0.0 -- -- 0.1  BASOSABS -- -- 0.0 -- -- 0.0  BANDABS -- -- -- -- -- --    CMP    Lab 04/16/11 2017 04/16/11 1331 04/13/11 0643 04/12/11 0453 04/11/11 1400 04/11/11 1359  NA -- 139 136 135 -- 136  K -- 4.1 3.5 3.5 -- 3.8  CL -- 104 102 105 -- 102  CO2 -- 28 29 25  -- 28  GLUCOSE -- 91 98 138* -- 116*  BUN -- 21 13 15  -- 19  CREATININE 1.21* 1.16* 1.13* 1.01 -- 1.26*  CALCIUM -- 10.1 10.4 10.1 -- 11.3*  MG -- -- -- -- -- --  AST -- 40* 16 -- 25 --  ALT -- 10 7 -- 9 --  ALKPHOS -- 87 73 -- 88 --  BILITOT -- 0.5 0.6 -- 0.9 --        Component Value Date/Time   BILITOT 0.5 04/16/2011 1331   BILIDIR 0.2 04/16/2011 1331   IBILI 0.3 04/16/2011 1331      Lab 04/11/11 2325  INR 1.14  PROTIME --    No results found for this basename: DDIMER:2 in the last 72 hours  Imaging Studies: Dg Chest 2 View  04/16/2011  *RADIOLOGY REPORT*  Clinical Data: Weakness.  CHEST - 2 VIEW  Comparison: 04/11/2011  Findings: Scattered parenchymal scarring and atelectasis bilaterally.  No evidence  of overt edema, pulmonary consolidation or pleural effusion.  Stable cardiomegaly.  Stable degenerative disease of the thoracic spine.  IMPRESSION: No active disease.  Scattered scarring and atelectasis present in both lungs.  Original Report Authenticated By: Reola Calkins, M.D.    Ct Head Wo Contrast  04/11/2011 *RADIOLOGY REPORT* Clinical Data: Dimension. CT HEAD WITHOUT CONTRAST Technique: Contiguous axial images were obtained from the base of the skull through the vertex without contrast. Comparison: None Findings: No acute intracranial abnormality. Specifically, no hemorrhage, hydrocephalus, mass lesion, acute infarction, or significant intracranial injury. No acute calvarial abnormality. Minimal chronic small  vessel disease in the deep white matter. Visualized paranasal sinuses and mastoids clear. Orbital soft tissues unremarkable. IMPRESSION: No acute intracranial abnormality. Original Report Authenticated By: Cyndie Chime, M.D.  Ct Angio Chest W/cm &/or Wo Cm  04/11/2011 *RADIOLOGY REPORT* Clinical Data: Dementia, not eating, weakness, diabetes, hypertension, elevated D-dimer, past history left breast cancer post chemotherapy CT ANGIOGRAPHY CHEST WITH CONTRAST Technique: Multidetector CT imaging of the chest was performed using the standard protocol during bolus administration of intravenous contrast. Multiplanar CT image reconstructions including MIPs were obtained to evaluate the vascular anatomy. Contrast: 80mL OMNIPAQUE IOHEXOL 350 MG/ML IV SOLN Comparison: None Findings: Aorta normal caliber without aneurysm or dissection. Scattered atherosclerotic calcifications aorta and coronary arteries. Visualized portion of upper abdomen unremarkable. Left axillary adenopathy, enlarged node measuring 1.9 x 2.3 cm image 14. No additional thoracic adenopathy. Pulmonary arteries appear grossly patent. Axial images demonstrate a questionable filling defect in a right lower lobe pulmonary artery though on  sagittal and coronal images this appears be due to either a kink in the vessel. No definite evidence of pulmonary embolism. Dependent atelectasis in both lungs. 7 mm diameter nonspecific right lower lobe nodule image 47. Scattered end plate spur formation thoracic spine. No definite acute bony lesions. Review of the MIP images confirms the above findings. IMPRESSION: No definite evidence of pulmonary embolism as above. Right lower lobe pulmonary nodule 7 mm diameter. Enlarged left axillary lymph node 2.3 x 1.9 cm. Metastatic disease not excluded in this patient with a history of breast cancer. Recommend comparison to prior outside exams to determine stability of these findings. Original Report Authenticated By: Lollie Marrow, M.D.     A/P: 70 y.o. female asked to see for evaluation of new found L axillary ln as well as new pulmonary nodules in a patient with a history of St I ductal carcinoma in situ dx in 2003, s/p lumpectomy and radiation. Pt needs tissue biopsy for diagnosis, which can be performed by interventional radiology while in hospital if pt is stable to undergo procedure.  Dr. Myna Hidalgo   is to see the patient following this consult with recommendations regarding further workup studies which may include tumor markers and further radiological studies. Pt lives in [place] and may benefit of oncological f/u in [place] once discharged.Thank you for the referral.  [facility] 04/17/2011 2:01 PM

## 2011-04-17 NOTE — Progress Notes (Signed)
This office note has been dictated.

## 2011-04-18 MED ORDER — LEVOTHYROXINE SODIUM 100 MCG PO TABS
100.0000 ug | ORAL_TABLET | Freq: Every day | ORAL | Status: DC
  Administered 2011-04-19 – 2011-04-21 (×3): 100 ug via ORAL
  Filled 2011-04-18 (×4): qty 1

## 2011-04-18 NOTE — Progress Notes (Signed)
Physical Therapy Evaluation Patient Details Name: Kristen Wheeler MRN: [id number] DOB: Jun 28, 1940 Today's Date: 04/18/2011  Problem List:  Patient Active Problem List  Diagnoses  . Hyperlipidemia  . Hypertension  . Thyroid disease  . Dementia  . Anorexia  . Weight loss, unintentional  . Lung nodule  . Dehydration  . UTI (lower urinary tract infection)  . Diabetes mellitus    Past Medical History:  Past Medical History  Diagnosis Date  . Dementia   . Thyroid disease   . Diabetes mellitus   . Hypertension   . Hyperlipidemia   . Breast cancer    Past Surgical History: History reviewed. No pertinent past surgical history.  PT Assessment/Plan/Recommendation PT Assessment Clinical Impression Statement: Pt. with decreased mobility secondary to generalized weakness resulting from decreased intake at home, with pt. denying that she has not been eating as she has a history of dementia.  Pt. ambulated with RW with min guard assist however was limited by fatigue.  As pt. stated that she normally does not require an assistive device, will recommend 24 hour min assist with HHPT vs. SNF if this cannot be provided.  PT Recommendation/Assessment: Patient will need skilled PT in the acute care venue PT Problem List: Decreased strength;Decreased activity tolerance;Decreased mobility;Decreased knowledge of precautions;Decreased cognition;Decreased knowledge of use of DME Barriers to Discharge: Other (comment) (Need to determine availability of 24 hour min assist) PT Therapy Diagnosis : Difficulty walking;Generalized weakness;Abnormality of gait PT Plan PT Frequency: Min 3X/week PT Treatment/Interventions: DME instruction;Gait training;Stair training;Functional mobility training;Patient/family education PT Recommendation Follow Up Recommendations: Home health PT;Skilled nursing facility;24 hour supervision/assistance (24 hr min assist with HHPT vs. SNF) Equipment Recommended: Rolling walker  with 5" wheels;3 in 1 bedside comode PT Goals  Acute Rehab PT Goals PT Goal Formulation: With patient Time For Goal Achievement: 2 weeks Pt will Roll Supine to Left Side: Independently PT Goal: Rolling Supine to Left Side - Progress: Other (comment) Pt will go Supine/Side to Sit: Independently;with HOB 0 degrees Pt will go Sit to Supine/Side: Independently;with HOB 0 degrees PT Goal: Sit to Supine/Side - Progress: Other (comment) Pt will go Sit to Stand: with modified independence PT Goal: Sit to Stand - Progress: Other (comment) Pt will go Stand to Sit: with modified independence PT Goal: Stand to Sit - Progress: Other (comment) Pt will Transfer Bed to Chair/Chair to Bed: with modified independence PT Transfer Goal: Bed to Chair/Chair to Bed - Progress: Other (comment) Pt will Ambulate: >150 feet;with modified independence;with least restrictive assistive device PT Goal: Ambulate - Progress: Other (comment) Pt will Go Up / Down Stairs: 1-2 stairs;with supervision PT Goal: Up/Down Stairs - Progress: Other (comment)  PT Evaluation Precautions/Restrictions  Precautions Precautions: Fall Prior Functioning  Home Living Lives With: Spouse;Son [place] Help From: Family Type of Home: House Home Layout: One level Home Access: Stairs to enter Entrance Stairs-Rails: None Entrance Stairs-Number of Steps: 2 Bathroom Shower/Tub: Associate Professor: Yes How Accessible: Accessible via walker Prior Function Level of Independence: Independent with basic ADLs;Independent with gait;Independent with transfers;Needs assistance with homemaking Meal Prep: Minimal Light Housekeeping: Minimal Driving: Yes Vocation: Retired Producer, television/film/video: Awake/alert Overall Cognitive Status: Impaired Memory: Appears impaired Memory Deficits: Short term memory deficits  Orientation Level: Oriented to person;Disoriented to place;Disoriented  to time;Disoriented to situation Sensation/Coordination   Extremity Assessment RLE Assessment RLE Assessment: Within Functional Limits LLE Assessment LLE Assessment: Within Functional Limits Mobility (including Balance) Bed Mobility Bed Mobility: Yes Rolling Left: 5:  Supervision;With rail Rolling Left Details (indicate cue type and reason): Verbal and tactile cues for use of rail and for initiation.  Left Sidelying to Sit: 5: Supervision;With rails;HOB elevated (comment degrees) (HOB 15 degrees) Left Sidelying to Sit Details (indicate cue type and reason): Verbal cueing for sequence.  Sitting - Scoot to Edge of Bed: 5: Supervision Sitting - Scoot to [place] of Bed Details (indicate cue type and reason): Cues for initiation.  Transfers Transfers: Yes Sit to Stand: 4: Min assist;From bed;From toilet Sit to Stand Details (indicate cue type and reason): Cues for hand placement and min assist for anterior translation and balance.  Stand to Sit: 4: Min assist;To toilet;To chair/3-in-1;With armrests Stand to Sit Details: Verbal cues for use of UE's to control descent, pt. is more supervision to recliner however requires min assist to toilet due to low height.  Ambulation/Gait Ambulation/Gait: Yes Ambulation/Gait Assistance: 4: Min assist Ambulation/Gait Assistance Details (indicate cue type and reason): Min guard assist secondary to weakness.  Ambulation Distance (Feet): 70 Feet (15 feet to bathroom, then out into hallway) Assistive device: Rolling walker Gait Pattern: Step-through pattern;Decreased stride length Stairs: No    Exercise  General Exercises - Lower Extremity Long Arc Quad: AROM;Both;15 reps;Seated Hip Flexion/Marching: AROM;Both;10 reps;Seated End of Session PT - End of Session Equipment Utilized During Treatment: Gait belt Activity Tolerance: Patient limited by fatigue Patient left: in chair;with call bell in reach Nurse Communication: Mobility status for  transfers;Mobility status for ambulation General Behavior During Session: [facility] for tasks performed Cognition: Impaired Cognitive Impairment: Pt. unoriented to time, situation and place and with short term memory deficits.  Pt. unable to state reason for admission, asking multiple times if she had been in a car accident.   Laney Pastor, SPT  04/18/2011, 9:09 AM

## 2011-04-18 NOTE — Progress Notes (Signed)
Occupational Therapy Evaluation Patient Details Name: Kristen Wheeler MRN: [id number] DOB: 01/16/1941 Today's Date: 04/18/2011  Problem List:  Patient Active Problem List  Diagnoses  . Hyperlipidemia  . Hypertension  . Thyroid disease  . Dementia  . Anorexia  . Weight loss, unintentional  . Lung nodule  . Dehydration  . UTI (lower urinary tract infection)  . Diabetes mellitus    Past Medical History:  Past Medical History  Diagnosis Date  . Dementia   . Thyroid disease   . Diabetes mellitus   . Hypertension   . Hyperlipidemia   . Breast cancer    Past Surgical History: History reviewed. No pertinent past surgical history.  OT Assessment/Plan/Recommendation OT Assessment Clinical Impression Statement: 70 yo female with FTT that could benefit from skilled OT to decr burden of care at d/c SNF OT Recommendation/Assessment: Patient will need skilled OT in the acute care venue OT Problem List: Decreased activity tolerance;Impaired balance (sitting and/or standing);Decreased cognition;Decreased safety awareness OT Therapy Diagnosis : Generalized weakness OT Plan OT Frequency: Min 1X/week OT Treatment/Interventions: Self-care/ADL training;Patient/family education;Balance training;Cognitive remediation/compensation;Therapeutic activities;DME and/or AE instruction OT Recommendation Follow Up Recommendations: Skilled nursing facility;Other (comment) (if d/c home will need 24/7 A) Equipment Recommended: 3 in 1 bedside comode Individuals Consulted Consulted and Agree with Results and Recommendations: Patient unable/family or caregiver not available OT Goals Acute Rehab OT Goals OT Goal Formulation: Patient unable to participate in goal setting Time For Goal Achievement: 2 weeks ADL Goals Pt Will Perform Grooming: with set-up;Standing at sink Pt Will Perform Upper Body Bathing: with set-up;Sitting, chair;Sitting, edge of bed Pt Will Perform Lower Body Bathing: with set-up;Sit  to stand from bed;Sit to stand from chair Pt Will Perform Upper Body Dressing: with set-up;Sit to stand from chair;Sit to stand from bed Pt Will Perform Lower Body Dressing: with set-up;Sit to stand from chair;Sit to stand from bed Pt Will Transfer to Toilet: with set-up;3-in-1 Pt Will Perform Toileting - Clothing Manipulation: with set-up;Sitting on 3-in-1 or toilet Pt Will Perform Toileting - Hygiene: with set-up;Standing at 3-in-1/toilet  OT Evaluation Precautions/Restrictions  Precautions Precautions: Fall Restrictions Weight Bearing Restrictions: No Prior Functioning Home Living Lives With: Spouse;Son [place] Help From: Family Type of Home: House Home Layout: One level Home Access: Stairs to enter Entrance Stairs-Rails: None Entrance Stairs-Number of Steps: 2 Bathroom Shower/Tub: Associate Professor: Yes How Accessible: Accessible via walker Additional Comments: Question reliability of pt  Prior Function Level of Independence: Independent with basic ADLs;Independent with gait;Independent with transfers;Needs assistance with homemaking Meal Prep: Minimal Light Housekeeping: Minimal Driving: Yes Vocation: Retired Comments: pt poor historian questioning reliability of above PTA  ADL ADL Eating/Feeding: Simulated;Set up;Other (comment) (Max encouragement, min v/c did well with visual cues) Eating/Feeding Details (indicate cue type and reason): pt eats better with social support present. OT providing pt with graham crackers and peanut butter. When OT engaged in a seperate pack of graham crackers pt began to copy sequence of opening crackers, opening spoon, opening peanut butter, applying peanut butter on spoon, spreading peanut butter on cracker and biting. Pt states "i love peanut butter, I use to love it too much all i did was eat". Pt enjoys social company with snack and ate entire cracker with 1/2 of soda. Where Assessed -  Eating/Feeding: Chair Equipment Used: Rolling walker ADL Comments: Pt with a lot of negative self talking. (I am stupid, everyone in my family is smart but me) Pt continued to engage with negative self talk  with all family discussion. Pt with positive talk regarding prior work history, [place] and family trips to [place]  in [place]. Pt taking tiny bites. pt requesting twice "do I have to eat this?" pt encouraged to finish one cracker and agreeable. pt c/o "my jaw is tired" pt reporting fatigue with snack. OT agrees with demonstrating signs of depression and negative self talk Vision/Perception  Vision - History Baseline Vision: No visual deficits Patient Visual Report: No change from baseline Cognition Cognition Arousal/Alertness: Awake/alert Overall Cognitive Status: Impaired Memory: Appears impaired Memory Deficits: Short term memory deficits  Orientation Level: Disoriented to place;Disoriented to time;Disoriented to situation;Oriented to person (Reports age as 70 yo, wrong year of DOB, oriented to date/mo) Sensation/Coordination Coordination Gross Motor Movements are Fluid and Coordinated: Yes Fine Motor Movements are Fluid and Coordinated: Yes Extremity Assessment RUE Assessment RUE Assessment: Within Functional Limits LUE Assessment LUE Assessment: Within Functional Limits Mobility  Bed Mobility Bed Mobility: Yes Supine to Sit: 5: Supervision;HOB elevated (Comment degrees);With rails (HOB=30 degrees, ) Sitting - Scoot to Edge of Bed: 5: Supervision Transfers Transfers: Yes Sit to Stand: 4: Min assist;From bed;With upper extremity assist Stand to Sit: 4: Min assist;To chair/3-in-1;With upper extremity assist End of Session OT - End of Session Activity Tolerance: Patient tolerated treatment well Patient left: in chair;with call bell in reach;Other (comment) (visitors for roommate pt present and talking with pt) Nurse Communication: Mobility  status for transfers General Behavior During Session: [facility] for tasks performed Cognition: Impaired Cognitive Impairment: Pt. unoriented to time, situation and place and with short term memory deficits.  Pt. unable to state reason for admission, asking multiple times if she had been in a car accident.  (pt states "i dont know how i got like this")  Pt could benefit from psych consult due to demonstrating depression (negative self talk, poor PO intake).    Harrel Carina [facility] 04/18/2011, 12:54 PM  Pager: [phone]

## 2011-04-18 NOTE — Progress Notes (Signed)
Seen and agree with SPT note Any Mcneice Tabor, PT [phone]  

## 2011-04-18 NOTE — Progress Notes (Signed)
Subjective: Patient doesn't know if she has been taking synthroid. Denies cough. She has been trying to eat.  Objective: Filed Vitals:   04/17/11 1825 04/17/11 2212 04/18/11 0648 04/18/11 1506  BP: 126/66 137/68 130/59 124/61  Pulse: 89 116 87 88  Temp: 99 F (37.2 C) 100.4 F (38 C) 99.3 F (37.4 C) 99 F (37.2 C)  TempSrc:  Oral Oral Oral  Resp: 17 20 20 17   Height:      SpO2: 98% 97% 93% 100%   Weight change:   Intake/Output Summary (Last 24 hours) at 04/18/11 1931 Last data filed at 04/18/11 1405  Gross per 24 hour  Intake   1040 ml  Output    300 ml  Net    740 ml    General: Alert, awake, oriented x3, in no acute distress.  HEENT: No bruits, no goiter.  Heart: Regular rate and rhythm, without murmurs, rubs, gallops.  Lungs: CTA, bilateral air movement.  Abdomen: Soft, nontender, nondistended, positive bowel sounds.  Neuro: Grossly intact, nonfocal. Extremity: no edema. Left axillary lymphadenopathy  Lab Results:  Kristen Wheeler 04/16/11 2017 04/16/11 1331  NA -- 139  K -- 4.1  CL -- 104  CO2 -- 28  GLUCOSE -- 91  BUN -- 21  CREATININE 1.21* 1.16*  CALCIUM -- 10.1  MG -- --  PHOS -- --    Basename 04/16/11 1331  AST 40*  ALT 10  ALKPHOS 87  BILITOT 0.5  PROT 6.7  ALBUMIN 2.3*   No results found for this basename: LIPASE:2,AMYLASE:2 in the last 72 hours  Basename 04/17/11 0535 04/16/11 2017 04/16/11 1331  WBC 3.6* 4.1 --  NEUTROABS -- -- 2.9  HGB 9.7* 10.2* --  HCT 30.2* 31.8* --  MCV 97.1 97.0 --  PLT 144* 147* --    Basename 04/17/11 1708 04/17/11 0857 04/17/11 0043  CKTOTAL 67 69 54  CKMB 1.7 2.0 1.5  CKMBINDEX -- -- --  TROPONINI <0.30 <0.30 <0.30    Basename 04/17/11 1350  TSH 7.981*  T4TOTAL --  T3FREE 0.9*  THYROIDAB --    Basename 04/17/11 1350  VITAMINB12 451  FOLATE 7.9  FERRITIN 1515*  TIBC 188*  IRON 21*  RETICCTPCT 1.2    Micro Results: Recent Results (from the past 240 hour(s))  URINE CULTURE     Status: Normal     Collection Time   04/11/11  1:25 PM      Component Value Range Status Comment   Specimen Description URINE, CLEAN CATCH   Final    Special Requests NONE   Final    Setup Time [id number]   Final    Colony Count NO GROWTH   Final    Culture NO GROWTH   Final    Report Status 04/12/2011 FINAL   Final     Studies/Results: No results found.  Medications: I have reviewed the patient's current medications.  Failure to thrive  Concern for malignancy. Patient with history of ductal carcinoma insitus, has left axillary lymphadenopathy. I will increase synthroid dose. PET scan in am.  PT , OT consult.    Dementia  Continue with donepezil.  Recent UTI  Repeat UA, continue with ciprofloxacin for now.  Patient had mild fever. If spike fever will get blood culture.  I ask nurse to get UA sample. UA order 12-19.  I will follow UA result, and will consider stop ciprofloxacin.  Anemia :  Low iron , increase ferritin. ?? Chronic diseases. I will check Guaiac  stool.  Hypothyroidism  I will increase synthroid. Will need to ask family if patient was taking synthroid.  Hypertension : continue with Norvasc. Diabetes mellitus; Monitor CBG.  Left axilary lymphadenopathy, Lung nodule. : Oncology consulted. Appreciated Dr Twanna Hy help. PET scan in am. I will check peripheral Smear.  Disposition: if patient is discharge home she will need 24 hour supervision,      LOS: 2 days   Kristen Wheeler M.D.  [facility] 04/18/2011, 7:31 PM

## 2011-04-18 NOTE — Consult Note (Signed)
NAMEMarland Wheeler  GORDIE, BELVIN NO.:  [id number]  MEDICAL RECORD NO.:  [id number]  LOCATION:  5118                         FACILITY:  [facility]  PHYSICIAN:  Josph Macho, M.D.  DATE OF BIRTH:  01/13/1941  DATE OF CONSULTATION: DATE OF DISCHARGE:                                CONSULTATION   REFERRING PHYSICIAN:  Hartley Barefoot, MD  REASON FOR CONSULTATION:  Weight loss, left axillary node on CT scan, right lung nodule; history of early stage breast cancer.  HISTORY OF PRESENT ILLNESS:  Ms. Macchi is a 70 year old white female. She is demented.  She cannot give too much history.  She apparently had a lumpectomy back in 2003.  As far as I can tell, Dr. Francina Ames did this.  I do not have any inflow on this.  She never got chemotherapy or any type of hormonal therapy.  She did get radiation therapy.  This was done up in [place].  She has lost weight over the past 6 months.  It sounds like she has lost 25 pounds or so.  She has had poor appetite.  She has had no nausea and vomiting.  She has had further weakness.  She was admitted to [facility].  I did get the results from her breast cancer surgery.  This was back in October 2003.  This was stage I (T1b N0 M0) right breast cancer.  The tumor was less than 1 cm.  Actually according to the pathology report, it is less than 1 mm.  She mostly had ductal carcinoma in situ.  Again, the invasive component was less than 1 mm.  As such, I would be hard pressed to say that she had infiltrating ductal carcinoma given such a small volume of invasive tumor.  A sentinel lymph node was negative.  She did receive radiation therapy.  She was admitted to [facility].  She had a CT angiogram done. Of course, the radiologist noticed that there was a 7-mm nodule in the right lower lobe.  She had a 2.3 x 1.9 cm left axillary lymph node.  No additional studies have been done.  She had a CT of the brain done. This was  unremarkable.  She had laboratory studies done.  Iron studies were low.  She had a CBC on October 19 with a white cell count 3.6, hemoglobin 9.7, hematocrit 30.2, and platelet count 144.  Chemistry studies showed sodium 139, potassium 4.1, BUN 21, creatinine 1.16.  Protein 6.7 with an albumin of 2.3.  LFTs were normal.  She had a D-dimer which was done for unknown reasons, 1.15.  We were asked to see her to try to manage these new problems that were seen on scan.  Again, she is not hurting.  She has had no rashes.  There are no obvious bowel or bladder changes.  She has not noted any cough.  She has had no headache.  Going to the dementia, she is not sure when she has had her routine health maintenance tests.  PAST MEDICAL HISTORY:  Remarkable for hypertension, hyperlipidemia, dementia, history of very early stage breast cancer.  ALLERGIES:  PENICILLIN.  MEDICATIONS: 1. Norvasc 10 mg daily.  2. Aspirin 81 mg daily. 3. Cipro 500 mg p.o. b.i.d. 4. Aricept 10 mg at bedtime. 5. Lovenox 40 mg daily. 6. Synthroid 0.075 mg daily. 7. Simvastatin 20 mg daily.  SOCIAL HISTORY:  Negative for tobacco or alcohol use.  FAMILY HISTORY:  Unremarkable.  There is history of colon cancer in maternal grandmother.  PHYSICAL EXAMINATION:  GENERAL:  This is a fairly well-developed, well- nourished white female, in no obvious distress.  She is alert and oriented x3. VITAL SIGNS:  Temperature 99, pulse 88, respiratory rate 17, and blood pressure 126/66. HEAD AND NECK:  No ocular or oral lesions.  There is no scleral icterus. NECK:  There is no adenopathy in her neck.  Thyroid is not palpable. LUNGS:  Clear bilaterally. CARDIAC:  Regular rate and rhythm with normal S1 and S2.  She has a 1/6 systolic ejection murmur. ABDOMEN:  Soft.  Good bowel sounds.  There is no palpable abdominal mass.  There is no fluid wave.  No palpable hepatosplenomegaly. AXILLARY:  Does show a mobile, nontender left  axilla lymph node.  It feels slightly firm.  No right axillary adenopathy is noted.  No breast masses noted bilaterally.  EXTREMITIES:  No clubbing, cyanosis, or edema. BACK:  No tenderness over the spine, ribs, or hips. NEUROLOGIC:  No focal neurological deficits.  IMPRESSION:  Ms. 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  I can understand why she did not receive any chemotherapy or hormonal therapy.  Back in 2003, ductal carcinoma in situ was just basically treated with radiation therapy and lumpectomy.  I find hard to believe that she would have cancer causing any of this. This lung nodule is so tiny.  This probably will not show up in PET scan.  She is iron deficient I can tell.  I think the stools need be checked. She has not had any colonoscopy in long term from which she can tell. As such, she is heme-positive.  She may need to have a colonoscopy and/or upper endoscopy.  We will have to see what the PET scan shows.  I think this is reasonable to order.  We will see that this left axilla lymph node is active.  We will see if there is any other potential areas of activity.  I am not at all impressed by the CT angiogram otherwise.  I cannot believe that her issues are because of cancer unless she has a widespread cancer in the abdomen that is not being picked up to date. Perhaps, we will get a CA 27-29 on her.  I will hold off on other tumor markers for now.  I will send a prealbumin of on her.  Again, she likely is going to need IV iron.  We will follow that along. Again, I think she needs to have stools checked.  I spoke to the family.  They are very very nice.  Hopefully, we can help Ms. Mouser out.     Josph Macho, M.D.     PRE/MEDQ  D:  04/17/2011  T:  04/18/2011  Job:  [id number]  cc:   [facility]

## 2011-04-18 NOTE — Progress Notes (Signed)
PET Scan scheduled in AM, NPO post midnight per protocol. [facility] notified about the schedule.

## 2011-04-19 ENCOUNTER — Encounter (HOSPITAL_COMMUNITY): Payer: Self-pay

## 2011-04-19 ENCOUNTER — Encounter (HOSPITAL_COMMUNITY): Payer: Self-pay | Admitting: General Surgery

## 2011-04-19 ENCOUNTER — Ambulatory Visit (HOSPITAL_COMMUNITY)
Admit: 2011-04-19 | Discharge: 2011-04-19 | Disposition: A | Discharge status: Home / Self Care | Payer: Medicare Other | Attending: Hematology & Oncology | Admitting: Hematology & Oncology

## 2011-04-19 DIAGNOSIS — R599 Enlarged lymph nodes, unspecified: Secondary | ICD-10-CM

## 2011-04-19 LAB — BASIC METABOLIC PANEL
BUN: 13 mg/dL (ref 6–23)
Chloride: 106 mEq/L (ref 96–112)
Creatinine, Ser: 1.15 mg/dL — ABNORMAL HIGH (ref 0.50–1.10)
GFR calc Af Amer: 55 mL/min — ABNORMAL LOW (ref >90)
Glucose, Bld: 91 mg/dL (ref 70–99)

## 2011-04-19 LAB — URINALYSIS, ROUTINE W REFLEX MICROSCOPIC
Bilirubin Urine: NEGATIVE
Glucose, UA: NEGATIVE mg/dL
Hgb urine dipstick: NEGATIVE
Specific Gravity, Urine: 1.009 (ref 1.005–1.030)
Urobilinogen, UA: 2 mg/dL — ABNORMAL HIGH (ref 0.0–1.0)
pH: 7 (ref 5.0–8.0)

## 2011-04-19 LAB — CBC
HCT: 30.7 % — ABNORMAL LOW (ref 36.0–46.0)
MCV: 95.6 fL (ref 78.0–100.0)
RDW: 13.4 % (ref 11.5–15.5)
WBC: 5.8 10*3/uL (ref 4.0–10.5)

## 2011-04-19 MED ORDER — FLUDEOXYGLUCOSE F - 18 (FDG) INJECTION
14.6000 | Freq: Once | INTRAVENOUS | Status: AC | PRN
  Administered 2011-04-19: 14.6 via INTRAVENOUS

## 2011-04-19 MED ORDER — POTASSIUM CHLORIDE CRYS ER 20 MEQ PO TBCR
40.0000 meq | EXTENDED_RELEASE_TABLET | Freq: Once | ORAL | Status: AC
  Administered 2011-04-19: 40 meq via ORAL
  Filled 2011-04-19: qty 2

## 2011-04-19 NOTE — Progress Notes (Signed)
   CARE MANAGEMENT NOTE 04/19/2011  Patient:  Kristen Wheeler, Kristen Wheeler   Account Number:  [id number]  Date Initiated:  04/17/2011  Documentation initiated by:  Donn Pierini  Subjective/Objective Assessment:   Pt admitted with FTT, anemia, weakness     Action/Plan:   PTA pt lived at home- family concerned about depression- PT/OT evals ordered   Anticipated DC Date:  04/19/2011   Anticipated DC Plan:  HOME W HOME HEALTH SERVICES  In-house referral  Clinical Social Worker      DC Planning Services  CM consult      Choice offered to / List presented to:             Status of service:  In process, will continue to follow Medicare Important Message given?   (If response is "NO", the following Medicare IM given date fields will be blank) Date Medicare IM given:   Date Additional Medicare IM given:    Discharge Disposition:    Per UR Regulation:    Comments:  PCP- Modesto Charon  04/19/11 16:23 Letha Cape RN, BSN [phone] Patient will need 24 hr supervision if goes home.  04/17/11- 1500- Donn Pierini RN, BSN [phone] PT/OT evals pending, CM to follow for potential d/c needs.

## 2011-04-19 NOTE — Progress Notes (Signed)
Ms. Headings is about the same.  So far, workup has been nondiagnostic. She did have a CA 27.29.  This was normal at 12.  Her prealbumin is only 3.0.  This is quite significant for malnutrition. Her albumin is 2.3.  Her thyroid tests were a little bit on the high side.  I wonder if hypothyroidism may not be part of the problem.  Her iron studies clearly show iron deficiency in my point of view.  Her ferritin, however, is quite elevated which is likely an acute phase reactant.  We are still awaiting the PET scan to be done.  Her CBC does show her platelet count to be 113 today.  Hemoglobin 10.1, hematocrit 30.7, white count is 5.8.  She is still not eating all that well.  She is not hurting.  There is no cough.  I wonder if a little Megace elixir may not be a bad idea for her.  Vital signs are stable.  Temperature is slightly elevated at 99.4.  In my mind, the key is going to be this PET scan.  I think if she has a malignancy, it is something totally different than her carcinoma in situ which is really which she had back in 2004.  We will have to watch the platelet counts.  This certainly could be medication related.  She is on Lovenox.  I think that if her platelet count continues to drop, Lovenox may be something that needs to be discontinued and she would need to be checked for heparin-induced thrombocytopenia.  This would be very unusual with low-molecular weight heparin.  On exam, I did not find anything specific.  There is still the left axillary lymph node.  I did not detect any lymphadenopathy elsewhere. There is no hepatosplenomegaly.  She has some trace edema in her legs. Neurological exam shows the dementia, but not much else.  Again, the workup is in progress.  It is certainly conceivable that we could find some underlying malignancy.  Will just have to wait the results of our additional studies.    ______________________________ Josph Macho,  M.D. PRE/MEDQ  D:  04/19/2011  T:  04/19/2011  Job:  [id number]

## 2011-04-19 NOTE — Progress Notes (Signed)
This office note has been dictated.

## 2011-04-19 NOTE — Progress Notes (Signed)
Subjective: Not eating much. I explain to her results of PET Scan. I call her son [name] and explain to him results of PET scan and plan of care.  Objective: Filed Vitals:   04/18/11 0648 04/18/11 1506 04/18/11 2203 04/19/11 0600  BP: 130/59 124/61 119/72 137/68  Pulse: 87 88 86 97  Temp: 99.3 F (37.4 C) 99 F (37.2 C) 98.5 F (36.9 C) 99.4 F (37.4 C)  TempSrc: Oral Oral Oral Oral  Resp: 20 17 19 20   Height:      SpO2: 93% 100% 99% 94%   Weight change:   Intake/Output Summary (Last 24 hours) at 04/19/11 1722 Last data filed at 04/19/11 0540  Gross per 24 hour  Intake 1555.33 ml  Output    800 ml  Net 755.33 ml    General: Alert, awake, oriented x3, in no acute distress.  HEENT: No bruits, no goiter.  Heart: Regular rate and rhythm, without murmurs, rubs, gallops.  Lungs: CTA, bilateral air movement.  Abdomen: Soft, nontender, nondistended, positive bowel sounds.  Neuro: Grossly intact, nonfocal. Extremities; no edema.   Lab Results:  [facility] 04/19/11 0505 04/16/11 2017  NA 138 --  K 3.1* --  CL 106 --  CO2 27 --  GLUCOSE 91 --  BUN 13 --  CREATININE 1.15* 1.21*  CALCIUM 9.6 --  MG -- --  PHOS -- --   Basename 04/19/11 0505 04/17/11 0535  WBC 5.8 3.6*  NEUTROABS -- --  HGB 10.1* 9.7*  HCT 30.7* 30.2*  MCV 95.6 97.1  PLT 113* 144*    Basename 04/17/11 1708 04/17/11 0857 04/17/11 0043  CKTOTAL 67 69 54  CKMB 1.7 2.0 1.5  CKMBINDEX -- -- --  TROPONINI <0.30 <0.30 <0.30    Basename 04/17/11 1350  TSH 7.981*  T4TOTAL --  T3FREE 0.9*  THYROIDAB --    Basename 04/17/11 1350  VITAMINB12 451  FOLATE 7.9  FERRITIN 1515*  TIBC 188*  IRON 21*  RETICCTPCT 1.2    Micro Results: Recent Results (from the past 240 hour(s))  URINE CULTURE     Status: Normal   Collection Time   04/11/11  1:25 PM      Component Value Range Status Comment   Specimen Description URINE, CLEAN CATCH   Final    Special Requests NONE   Final    Setup Time [id number]    Final    Colony Count NO GROWTH   Final    Culture NO GROWTH   Final    Report Status 04/12/2011 FINAL   Final   PATHOLOGIST SMEAR REVIEW     Status: Normal   Collection Time   04/18/11  8:51 PM      Component Value Range Status Comment   Tech Review          Final     Studies/Results: Nm Pet Image Initial (pi) Skull Base To Thigh  04/19/2011  *RADIOLOGY REPORT*  Clinical Data:  Initial treatment strategy for pulmonary nodule.  NUCLEAR MEDICINE PET CT INITIAL (PI) SKULL BASE TO THIGH  Technique:  14.6 mCi F-18 FDG was injected intravenously via the left forearm.  Full-ring PET imaging was performed from the skull base through the mid-thighs 55  minutes after injection.  CT data was obtained and used for attenuation correction and anatomic localization only.  (This was not acquired as a diagnostic CT examination.)  Fasting Blood Glucose:  96  Patient Weight:  158 pounds.  Comparison:  None  Findings: Fluid levels  noted within the paranasal sinuses.  Bilateral hypermetabolic cervical lymph nodes identified.  Left level II lymph node measures 1.1 cm and has an SUV max equal to 7.0  Right level II B lymph node measures 1.1 cm and has an SUV max equal to 7.0.  Within the left axilla there is an enlarged node measuring 1.9 cm. The SUV max associated with this lymph node is equal to 1.9.  No hypermetabolic right axillary lymph nodes. Nodule within the left side of the thyroid bed exhibits increased FDG uptake within SUV max equal to 7.0.  No hypermetabolic mediastinal or hilar lymph nodes.  There is no pericardial or pleural effusion identified.  The 7 mm nodule within the right lower lobe is too small to characterize but does not exhibit any malignant range FDG uptake.  There is no hypermetabolic pulmonary parenchymal nodule or mass identified.  There is no abnormal FDG uptake within the liver parenchyma.  No abnormal liver uptake identified.  The spleen is borderline enlarged measuring 12 cm in craniocaudal  dimension.  Both adrenal glands appear normal.  There is no abnormal pancreatic abnormality.  No hypermetabolic upper abdominal lymph nodes identified.  There is a small amount of ascites identified within the abdomen or pelvis.  No hypermetabolic pelvic or inguinal adenopathy.  IMPRESSION:  1.  Bilateral cervical lymph nodes exhibit malignant range FDG uptake.  Cannot rule out metastatic adenopathy. 2.  Hypermetabolic nodule within the left lobe of the thyroid gland.  Recommend further evaluation with thyroid sonography.  3.  Enlarged left axillary lymph node exhibits low level, borderline FDG uptake. 4.  Splenomegaly. 5.  Right lower lobe pulmonary nodule is too small reliably characterize by PET-CT.  Original Report Authenticated By: Rosealee Albee, M.D.    Medications: I have reviewed the patient's current medications.  Failure to thrive  Concern for malignancy. Patient with history of ductal carcinoma insitus, has left axillary lymphadenopathy. PET scan showed : 1.  Bilateral cervical lymph nodes exhibit malignant range FDG uptake.  Cannot rule out metastatic adenopathy. 2.  Hypermetabolic nodule within the left lobe of the thyroid gland.  Recommend further evaluation with thyroid sonography.  3.  Enlarged left axillary lymph node exhibits low level, borderline FDG uptake. 4.  Splenomegaly. 5.  Right lower lobe pulmonary nodule is too small reliably Plan; Surgery consulted for excisional Biopsy. Dr Cherie Ouch does not recommend core biopsy.   Dementia  Continue with donepezil.  Recent UTI  Repeated UA normal.   I will stopped ciprofloxacin.  Will follow urine culture.   Day 3 ciprofloxacin since admission.  Anemia :  Low iron , increase ferritin. ?? Chronic diseases.  Guaiac stool pending. Marland Kitchen  Hypothyroidism  Continue with synthroid, dose increase 12-20. Marland Kitchen  Hypertension : continue with Norvasc.  Diabetes mellitus; Monitor CBG.  Left axilary lymphadenopathy,: PET scan showed hypermetabolic  nodule. Asked surgery for lymph node excision.  Hypokalemia: replete.       LOS: 3 days   Ethelwyn Gilbertson M.D.  [facility] 04/19/2011, 5:22 PM

## 2011-04-19 NOTE — Progress Notes (Signed)
Met with patient and her husband. PET scan completed today and they will await results. Patient relates concerns about returning home as her husband cannot manage her at home. They live in [place] but would prefer [place] for SNF. Patient had questions regarding insurance coverage and possible need for Medicaid. Attempted to answer questions but will need to check into this further. Will ask weekend coverage to send out [facility] for placement if possible.  Darylene Price, BSW, 04/19/2011 5:11 PM

## 2011-04-19 NOTE — Consult Note (Signed)
Reason for Consult:failure to thrive Referring Physician: Dr. Rufina Falco Defilippo is an 70 y.o. female.  HPI: the patient is a 70 year old white female who has been in and out of the hospital with failure to thrive. She has a history of right breast DCIS. She recently had a PET scan that showed some enlarged axillary and cervical lymphadenopathy that was suggestive of metastatic disease. She states that she has no real appetite. She feels weak. She states that she has been losing weight.  Past Medical History  Diagnosis Date  . Dementia   . Thyroid disease   . Diabetes mellitus   . Hypertension   . Hyperlipidemia   . Breast cancer     History reviewed. No pertinent past surgical history.  Family History  Problem Relation Age of Onset  . Diabetes Mother   . Coronary artery disease Mother   . Coronary artery disease Father     Social History:  reports that she has never smoked. She does not have any smokeless tobacco history on file. She reports that she does not drink alcohol or use illicit drugs.  Allergies:  Allergies  Allergen Reactions  . Penicillins     Advised was allergic to penicillin early in life.    Medications: I have reviewed the patient's current medications.  Results for orders placed during the hospital encounter of 04/16/11 (from the past 48 hour(s))  CARDIAC PANEL(CRET KIN+CKTOT+MB+TROPI)     Status: Normal   Collection Time   04/17/11  5:08 PM      Component Value Range Comment   Total CK 67  7 - 177 (U/L)    CK, MB 1.7  0.3 - 4.0 (ng/mL)    Troponin I <0.30  <0.30 (ng/mL)    Relative Index RELATIVE INDEX IS INVALID  0.0 - 2.5    CANCER ANTIGEN 27.29     Status: Normal   Collection Time   04/18/11  7:00 AM      Component Value Range Comment   CA 27.29 12  0 - 39 (U/mL)   PREALBUMIN     Status: Abnormal   Collection Time   04/18/11  7:00 AM      Component Value Range Comment   Prealbumin 3.0 (*) 17.0 - 34.0 (mg/dL)   PATHOLOGIST SMEAR  REVIEW     Status: Normal   Collection Time   04/18/11  8:51 PM      Component Value Range Comment   Tech Review            URINALYSIS, ROUTINE W REFLEX MICROSCOPIC     Status: Abnormal   Collection Time   04/19/11  2:57 AM      Component Value Range Comment   Color, Urine YELLOW  YELLOW     APPearance CLEAR  CLEAR     Specific Gravity, Urine 1.009  1.005 - 1.030     pH 7.0  5.0 - 8.0     Glucose, UA NEGATIVE  NEGATIVE (mg/dL)    Hgb urine dipstick NEGATIVE  NEGATIVE     Bilirubin Urine NEGATIVE  NEGATIVE     Ketones, ur NEGATIVE  NEGATIVE (mg/dL)    Protein, ur NEGATIVE  NEGATIVE (mg/dL)    Urobilinogen, UA 2.0 (*) 0.0 - 1.0 (mg/dL)    Nitrite NEGATIVE  NEGATIVE     Leukocytes, UA NEGATIVE  NEGATIVE  MICROSCOPIC NOT DONE ON URINES WITH NEGATIVE PROTEIN, BLOOD, LEUKOCYTES, NITRITE, OR GLUCOSE <1000 mg/dL.  CBC  Status: Abnormal   Collection Time   04/19/11  5:05 AM      Component Value Range Comment   WBC 5.8  4.0 - 10.5 (K/uL)    RBC 3.21 (*) 3.87 - 5.11 (MIL/uL)    Hemoglobin 10.1 (*) 12.0 - 15.0 (g/dL)    HCT 40.9 (*) 81.1 - 46.0 (%)    MCV 95.6  78.0 - 100.0 (fL)    MCH 31.5  26.0 - 34.0 (pg)    MCHC 32.9  30.0 - 36.0 (g/dL)    RDW 91.4  78.2 - 95.6 (%)    Platelets 113 (*) 150 - 400 (K/uL) PLATELET COUNT CONFIRMED BY SMEAR  BASIC METABOLIC PANEL     Status: Abnormal   Collection Time   04/19/11  5:05 AM      Component Value Range Comment   Sodium 138  135 - 145 (mEq/L)    Potassium 3.1 (*) 3.5 - 5.1 (mEq/L)    Chloride 106  96 - 112 (mEq/L)    CO2 27  19 - 32 (mEq/L)    Glucose, Bld 91  70 - 99 (mg/dL)    BUN 13  6 - 23 (mg/dL)    Creatinine, Ser 2.13 (*) 0.50 - 1.10 (mg/dL)    Calcium 9.6  8.4 - 10.5 (mg/dL)    GFR calc non Af Amer 47 (*) >90 (mL/min)    GFR calc Af Amer 55 (*) >90 (mL/min)     Nm Pet Image Initial (pi) Skull Base To Thigh  04/19/2011  *RADIOLOGY REPORT*  Clinical Data:  Initial treatment strategy for pulmonary nodule.  NUCLEAR MEDICINE  PET CT INITIAL (PI) SKULL BASE TO THIGH  Technique:  14.6 mCi F-18 FDG was injected intravenously via the left forearm.  Full-ring PET imaging was performed from the skull base through the mid-thighs 55  minutes after injection.  CT data was obtained and used for attenuation correction and anatomic localization only.  (This was not acquired as a diagnostic CT examination.)  Fasting Blood Glucose:  96  Patient Weight:  158 pounds.  Comparison:  None  Findings: Fluid levels noted within the paranasal sinuses.  Bilateral hypermetabolic cervical lymph nodes identified.  Left level II lymph node measures 1.1 cm and has an SUV max equal to 7.0  Right level II B lymph node measures 1.1 cm and has an SUV max equal to 7.0.  Within the left axilla there is an enlarged node measuring 1.9 cm. The SUV max associated with this lymph node is equal to 1.9.  No hypermetabolic right axillary lymph nodes. Nodule within the left side of the thyroid bed exhibits increased FDG uptake within SUV max equal to 7.0.  No hypermetabolic mediastinal or hilar lymph nodes.  There is no pericardial or pleural effusion identified.  The 7 mm nodule within the right lower lobe is too small to characterize but does not exhibit any malignant range FDG uptake.  There is no hypermetabolic pulmonary parenchymal nodule or mass identified.  There is no abnormal FDG uptake within the liver parenchyma.  No abnormal liver uptake identified.  The spleen is borderline enlarged measuring 12 cm in craniocaudal dimension.  Both adrenal glands appear normal.  There is no abnormal pancreatic abnormality.  No hypermetabolic upper abdominal lymph nodes identified.  There is a small amount of ascites identified within the abdomen or pelvis.  No hypermetabolic pelvic or inguinal adenopathy.  IMPRESSION:  1.  Bilateral cervical lymph nodes exhibit malignant range FDG uptake.  Cannot rule out metastatic adenopathy. 2.  Hypermetabolic nodule within the left lobe of the  thyroid gland.  Recommend further evaluation with thyroid sonography.  3.  Enlarged left axillary lymph node exhibits low level, borderline FDG uptake. 4.  Splenomegaly. 5.  Right lower lobe pulmonary nodule is too small reliably characterize by PET-CT.  Original Report Authenticated By: Rosealee Albee, M.D.    Review of Systems  Constitutional: Positive for weight loss and malaise/fatigue.  HENT: Negative.   Eyes: Negative.   Respiratory: Negative.   Cardiovascular: Negative.   Gastrointestinal: Negative.   Genitourinary: Negative.   Musculoskeletal: Negative.   Skin: Negative.   Neurological: Negative.   Endo/Heme/Allergies: Negative.   Psychiatric/Behavioral: Positive for memory loss.   Blood pressure 137/68, pulse 97, temperature 99.4 F (37.4 C), temperature source Oral, resp. rate 20, height 5\' 2"  (1.575 m), SpO2 94.00%. Physical Exam  Constitutional: She is oriented to person, place, and time. She appears well-developed and well-nourished.  HENT:  Head: Normocephalic and atraumatic.  Eyes: Conjunctivae and EOM are normal. Pupils are equal, round, and reactive to light.  Neck: Normal range of motion. Neck supple.  Cardiovascular: Normal rate, regular rhythm and normal heart sounds.   Respiratory: Effort normal and breath sounds normal.       Palpable left axillary lymph node  GI: Soft. Bowel sounds are normal. There is no tenderness.  Musculoskeletal: Normal range of motion.  Lymphadenopathy:    She has cervical adenopathy.  Neurological: She is alert and oriented to person, place, and time.  Skin: Skin is warm and dry.  Psychiatric:       Flat affect    Assessment/Plan: 70 year old white female with failure to thrive and a suspicious left axillary lymph node.I think initially a core biopsy of the left axillary lymph node would be reasonable. If this is metastatic breast cancer then that would be adequate to diagnose. If not then we could potentially do an open surgical  biopsy of the lymph node.  TOTH III,Domnique Vantine S 04/19/2011, 5:00 PM

## 2011-04-20 MED ORDER — POTASSIUM CHLORIDE CRYS ER 20 MEQ PO TBCR
40.0000 meq | EXTENDED_RELEASE_TABLET | Freq: Once | ORAL | Status: AC
  Administered 2011-04-20: 40 meq via ORAL
  Filled 2011-04-20: qty 2

## 2011-04-20 NOTE — Progress Notes (Signed)
FL2 completed for signature. FL2 faxed to [employer]. [employer]  [place] submitted. Milus Banister MSW,LCSW w/e Coverage [phone]

## 2011-04-20 NOTE — Progress Notes (Signed)
Subjective: Feeling better, trying to eat. Denies abdominal pain.  Objective: Filed Vitals:   04/19/11 2157 04/19/11 2247 04/20/11 0551 04/20/11 1445  BP: 119/60 119/60 143/67 132/64  Pulse: 107  97 114  Temp: 99 F (37.2 C)  99.3 F (37.4 C) 98.7 F (37.1 C)  TempSrc: Oral  Oral Oral  Resp: 18  18 20   Height:      Weight:      SpO2: 98%  96% 98%   Weight change:   Intake/Output Summary (Last 24 hours) at 04/20/11 1632 Last data filed at 04/20/11 1500  Gross per 24 hour  Intake   2788 ml  Output   1300 ml  Net   1488 ml    General: Alert, awake, oriented x3, in no acute distress.  HEENT: No bruits, no goiter.  Heart: Regular rate and rhythm, without murmurs, rubs, gallops.  Lungs: CTA, bilateral air movement.  Abdomen: Soft, nontender, nondistended, positive bowel sounds.  Neuro: Grossly intact, nonfocal. Extremities; no edema  Lab Results:  [facility] 04/19/11 0505  NA 138  K 3.1*  CL 106  CO2 27  GLUCOSE 91  BUN 13  CREATININE 1.15*  CALCIUM 9.6  MG --  PHOS --    Basename 04/19/11 0505  WBC 5.8  NEUTROABS --  HGB 10.1*  HCT 30.7*  MCV 95.6  PLT 113*    Basename 04/17/11 1708  CKTOTAL 67  CKMB 1.7  CKMBINDEX --  TROPONINI <0.30    Micro Results: Recent Results (from the past 240 hour(s))  URINE CULTURE     Status: Normal   Collection Time   04/11/11  1:25 PM      Component Value Range Status Comment   Specimen Description URINE, CLEAN CATCH   Final    Special Requests NONE   Final    Setup Time [id number]   Final    Colony Count NO GROWTH   Final    Culture NO GROWTH   Final    Report Status 04/12/2011 FINAL   Final   PATHOLOGIST SMEAR REVIEW     Status: Normal   Collection Time   04/18/11  8:51 PM      Component Value Range Status Comment   Tech Review          Final   URINE CULTURE     Status: Normal (Preliminary result)   Collection Time   04/19/11  2:57 AM      Component Value Range Status Comment   Specimen Description  URINE, CLEAN CATCH   Final    Special Requests NONE   Final    Setup Time [id number]   Final    Colony Count PENDING   Incomplete    Culture Culture reincubated for better growth   Final    Report Status PENDING   Incomplete     Studies/Results: Nm Pet Image Initial (pi) Skull Base To Thigh  04/19/2011  *RADIOLOGY REPORT*  Clinical Data:  Initial treatment strategy for pulmonary nodule.  NUCLEAR MEDICINE PET CT INITIAL (PI) SKULL BASE TO THIGH  Technique:  14.6 mCi F-18 FDG was injected intravenously via the left forearm.  Full-ring PET imaging was performed from the skull base through the mid-thighs 55  minutes after injection.  CT data was obtained and used for attenuation correction and anatomic localization only.  (This was not acquired as a diagnostic CT examination.)  Fasting Blood Glucose:  96  Patient Weight:  158 pounds.  Comparison:  None  Findings:  Fluid levels noted within the paranasal sinuses.  Bilateral hypermetabolic cervical lymph nodes identified.  Left level II lymph node measures 1.1 cm and has an SUV max equal to 7.0  Right level II B lymph node measures 1.1 cm and has an SUV max equal to 7.0.  Within the left axilla there is an enlarged node measuring 1.9 cm. The SUV max associated with this lymph node is equal to 1.9.  No hypermetabolic right axillary lymph nodes. Nodule within the left side of the thyroid bed exhibits increased FDG uptake within SUV max equal to 7.0.  No hypermetabolic mediastinal or hilar lymph nodes.  There is no pericardial or pleural effusion identified.  The 7 mm nodule within the right lower lobe is too small to characterize but does not exhibit any malignant range FDG uptake.  There is no hypermetabolic pulmonary parenchymal nodule or mass identified.  There is no abnormal FDG uptake within the liver parenchyma.  No abnormal liver uptake identified.  The spleen is borderline enlarged measuring 12 cm in craniocaudal dimension.  Both adrenal glands appear  normal.  There is no abnormal pancreatic abnormality.  No hypermetabolic upper abdominal lymph nodes identified.  There is a small amount of ascites identified within the abdomen or pelvis.  No hypermetabolic pelvic or inguinal adenopathy.  IMPRESSION:  1.  Bilateral cervical lymph nodes exhibit malignant range FDG uptake.  Cannot rule out metastatic adenopathy. 2.  Hypermetabolic nodule within the left lobe of the thyroid gland.  Recommend further evaluation with thyroid sonography.  3.  Enlarged left axillary lymph node exhibits low level, borderline FDG uptake. 4.  Splenomegaly. 5.  Right lower lobe pulmonary nodule is too small reliably characterize by PET-CT.  Original Report Authenticated By: Rosealee Albee, M.D.    Medications: I have reviewed the patient's current medications.   Failure to thrive  Concern for malignancy. Patient with history of ductal carcinoma insitus, has left axillary lymphadenopathy. PET scan showed : 1. Bilateral cervical lymph nodes exhibit malignant range FDG uptake. Cannot rule out metastatic adenopathy. 2. Hypermetabolic nodule within the left lobe of the thyroid gland. Recommend further evaluation with thyroid sonography. 3. Enlarged left axillary lymph node exhibits low level, borderline FDG uptake. 4. Splenomegaly. 5. Right lower lobe pulmonary nodule is too small reliably  Plan; Surgery consulted for excisional Biopsy. Dr Cherie Ouch does not recommend core biopsy.  Patients wants to follow up with Dr Twanna Hy for her care. I will giver Dr Twanna Hy office number prior to discharge.  Biopsy wont be able to be done until later next week. Patient wants to go home for holidays.  Will plan to discharge 12-23. Will contact  surgery to schedule biopsy.   Dementia  Continue with donepezil.   Recent UTI  Repeated UA normal.  I will stopped ciprofloxacin.  Will follow urine culture.  Day 3 ciprofloxacin since admission.  Anemia :  Low iron , increase ferritin. ?? Chronic  diseases.  Guaiac stool pending. Marland Kitchen  Hypothyroidism  Continue with synthroid, dose increase 12-20. Marland Kitchen  Hypertension : continue with Norvasc.  Diabetes mellitus; Monitor CBG.  Left axilary lymphadenopathy,: PET scan showed hypermetabolic nodule. Asked surgery for lymph node excision.  Hypokalemia: replete.  Thrombocytopenia: stop Lovenox and aspirin.    LOS: 4 days   REGALADO,BELKYS M.D.  [facility] 04/20/2011, 4:32 PM

## 2011-04-20 NOTE — Progress Notes (Signed)
Subjective: Reviewed notes and scans. Oncology requests excisional bx.  Objective: Vital signs in last 24 hours: Temp:  [99 F (37.2 C)-99.3 F (37.4 C)] 99.3 F (37.4 C) (12/22 0551) Pulse Rate:  [97-107] 97  (12/22 0551) Resp:  [18] 18  (12/22 0551) BP: (119-143)/(60-67) 143/67 mmHg (12/22 0551) SpO2:  [96 %-98 %] 96 % (12/22 0551) Last BM Date: 04/14/11  Intake/Output this shift: Total I/O In: 682 [I.V.:682] Out: -   Physical Exam: BP 143/67  Pulse 97  Temp(Src) 99.3 F (37.4 C) (Oral)  Resp 18  Ht 5\' 2"  (1.575 m)  SpO2 96% (L)axillary lump is very soft, mobile and feels more lipomatous than malignant node. Cervical nodes not palpable  Labs: CBC  Basename 04/19/11 0505  WBC 5.8  HGB 10.1*  HCT 30.7*  PLT 113*   BMET  Basename 04/19/11 0505  NA 138  K 3.1*  CL 106  CO2 27  GLUCOSE 91  BUN 13  CREATININE 1.15*  CALCIUM 9.6   LFT No results found for this basename: PROT,ALBUMIN,AST,ALT,ALKPHOS,BILITOT,BILIDIR,IBILI,LIPASE in the last 72 hours PT/INR No results found for this basename: LABPROT:2,INR:2 in the last 72 hours ABG No results found for this basename: PHART:2,PCO2:2,PO2:2,HCO3:2 in the last 72 hours  Studies/Results: Nm Pet Image Initial (pi) Skull Base To Thigh  04/19/2011  *RADIOLOGY REPORT*  Clinical Data:  Initial treatment strategy for pulmonary nodule.  NUCLEAR MEDICINE PET CT INITIAL (PI) SKULL BASE TO THIGH  Technique:  14.6 mCi F-18 FDG was injected intravenously via the left forearm.  Full-ring PET imaging was performed from the skull base through the mid-thighs 55  minutes after injection.  CT data was obtained and used for attenuation correction and anatomic localization only.  (This was not acquired as a diagnostic CT examination.)  Fasting Blood Glucose:  96  Patient Weight:  158 pounds.  Comparison:  None  Findings: Fluid levels noted within the paranasal sinuses.  Bilateral hypermetabolic cervical lymph nodes identified.  Left  level II lymph node measures 1.1 cm and has an SUV max equal to 7.0  Right level II B lymph node measures 1.1 cm and has an SUV max equal to 7.0.  Within the left axilla there is an enlarged node measuring 1.9 cm. The SUV max associated with this lymph node is equal to 1.9.  No hypermetabolic right axillary lymph nodes. Nodule within the left side of the thyroid bed exhibits increased FDG uptake within SUV max equal to 7.0.  No hypermetabolic mediastinal or hilar lymph nodes.  There is no pericardial or pleural effusion identified.  The 7 mm nodule within the right lower lobe is too small to characterize but does not exhibit any malignant range FDG uptake.  There is no hypermetabolic pulmonary parenchymal nodule or mass identified.  There is no abnormal FDG uptake within the liver parenchyma.  No abnormal liver uptake identified.  The spleen is borderline enlarged measuring 12 cm in craniocaudal dimension.  Both adrenal glands appear normal.  There is no abnormal pancreatic abnormality.  No hypermetabolic upper abdominal lymph nodes identified.  There is a small amount of ascites identified within the abdomen or pelvis.  No hypermetabolic pelvic or inguinal adenopathy.  IMPRESSION:  1.  Bilateral cervical lymph nodes exhibit malignant range FDG uptake.  Cannot rule out metastatic adenopathy. 2.  Hypermetabolic nodule within the left lobe of the thyroid gland.  Recommend further evaluation with thyroid sonography.  3.  Enlarged left axillary lymph node exhibits low level, borderline FDG  uptake. 4.  Splenomegaly. 5.  Right lower lobe pulmonary nodule is too small reliably characterize by PET-CT.  Original Report Authenticated By: Rosealee Albee, M.D.    Assessment: Lymphadenopathy     Plan: Not sure bx of (L)axillary would yield any results, cervical nodes would be very hard to dissect out. Also, this is not an emergent procedure and Im not sure pathology would even be here through holiday. Wonder if pt  could be discharged and return after holiday for scheduled biopsy. Will d/w rads and pathology about best course.  LOS: 4 days    Iona Coach 04/20/2011

## 2011-04-21 ENCOUNTER — Other Ambulatory Visit: Payer: Self-pay

## 2011-04-21 LAB — CBC
HCT: 33.7 % — ABNORMAL LOW (ref 36.0–46.0)
Hemoglobin: 11 g/dL — ABNORMAL LOW (ref 12.0–15.0)
MCH: 31 pg (ref 26.0–34.0)
MCHC: 32.6 g/dL (ref 30.0–36.0)
MCV: 94.9 fL (ref 78.0–100.0)
RDW: 13.3 % (ref 11.5–15.5)

## 2011-04-21 LAB — BASIC METABOLIC PANEL
BUN: 10 mg/dL (ref 6–23)
Creatinine, Ser: 0.97 mg/dL (ref 0.50–1.10)
GFR calc Af Amer: 67 mL/min — ABNORMAL LOW (ref >90)
GFR calc non Af Amer: 58 mL/min — ABNORMAL LOW (ref >90)
Glucose, Bld: 86 mg/dL (ref 70–99)

## 2011-04-21 LAB — URINE CULTURE
Colony Count: 40000
Culture  Setup Time: 201212210408

## 2011-04-21 MED ORDER — ENSURE IMMUNE HEALTH PO LIQD: 237.0000 mL | Freq: Three times a day (TID) | ORAL | Status: DC

## 2011-04-21 MED ORDER — LEVOTHYROXINE SODIUM 100 MCG PO TABS: 100.0000 ug | ORAL_TABLET | Freq: Every day | ORAL | Status: DC

## 2011-04-21 NOTE — Progress Notes (Signed)
Subjective: Pt ok, No new c/o. Still feels weak. Reviewed PET with Rads, lesion in thyroid and cervical,submandibular adenopathy looks more suspicious than (L)axiallry lesion, that looks more fluid or adipose than nodular.  Objective: Vital signs in last 24 hours: Temp:  [98.7 F (37.1 C)-99.1 F (37.3 C)] 99.1 F (37.3 C) (12/23 0540) Pulse Rate:  [113-119] 119  (12/23 0540) Resp:  [18-20] 18  (12/23 0540) BP: (128-132)/(64-94) 128/76 mmHg (12/23 0540) SpO2:  [94 %-98 %] 96 % (12/23 0540) Last BM Date: 04/14/11  Intake/Output this shift:    Physical Exam: BP 128/76  Pulse 119  Temp(Src) 99.1 F (37.3 C) (Oral)  Resp 18  Ht 5\' 2"  (1.575 m)  Wt 158 lb 12.8 oz (72.031 kg)  BMI 29.04 kg/m2  SpO2 96% No change.  Labs: CBC  Basename 04/21/11 0550 04/19/11 0505  WBC 9.4 5.8  HGB 11.0* 10.1*  HCT 33.7* 30.7*  PLT 178 113*   BMET  Basename 04/21/11 0550 04/19/11 0505  NA 137 138  K 3.5 3.1*  CL 105 106  CO2 24 27  GLUCOSE 86 91  BUN 10 13  CREATININE 0.97 1.15*  CALCIUM 9.8 9.6   LFT No results found for this basename: PROT,ALBUMIN,AST,ALT,ALKPHOS,BILITOT,BILIDIR,IBILI,LIPASE in the last 72 hours PT/INR No results found for this basename: LABPROT:2,INR:2 in the last 72 hours ABG No results found for this basename: PHART:2,PCO2:2,PO2:2,HCO3:2 in the last 72 hours  Studies/Results: Nm Pet Image Initial (pi) Skull Base To Thigh  04/19/2011  *RADIOLOGY REPORT*  Clinical Data:  Initial treatment strategy for pulmonary nodule.  NUCLEAR MEDICINE PET CT INITIAL (PI) SKULL BASE TO THIGH  Technique:  14.6 mCi F-18 FDG was injected intravenously via the left forearm.  Full-ring PET imaging was performed from the skull base through the mid-thighs 55  minutes after injection.  CT data was obtained and used for attenuation correction and anatomic localization only.  (This was not acquired as a diagnostic CT examination.)  Fasting Blood Glucose:  96  Patient Weight:  158  pounds.  Comparison:  None  Findings: Fluid levels noted within the paranasal sinuses.  Bilateral hypermetabolic cervical lymph nodes identified.  Left level II lymph node measures 1.1 cm and has an SUV max equal to 7.0  Right level II B lymph node measures 1.1 cm and has an SUV max equal to 7.0.  Within the left axilla there is an enlarged node measuring 1.9 cm. The SUV max associated with this lymph node is equal to 1.9.  No hypermetabolic right axillary lymph nodes. Nodule within the left side of the thyroid bed exhibits increased FDG uptake within SUV max equal to 7.0.  No hypermetabolic mediastinal or hilar lymph nodes.  There is no pericardial or pleural effusion identified.  The 7 mm nodule within the right lower lobe is too small to characterize but does not exhibit any malignant range FDG uptake.  There is no hypermetabolic pulmonary parenchymal nodule or mass identified.  There is no abnormal FDG uptake within the liver parenchyma.  No abnormal liver uptake identified.  The spleen is borderline enlarged measuring 12 cm in craniocaudal dimension.  Both adrenal glands appear normal.  There is no abnormal pancreatic abnormality.  No hypermetabolic upper abdominal lymph nodes identified.  There is a small amount of ascites identified within the abdomen or pelvis.  No hypermetabolic pelvic or inguinal adenopathy.  IMPRESSION:  1.  Bilateral cervical lymph nodes exhibit malignant range FDG uptake.  Cannot rule out metastatic adenopathy. 2.  Hypermetabolic nodule within the left lobe of the thyroid gland.  Recommend further evaluation with thyroid sonography.  3.  Enlarged left axillary lymph node exhibits low level, borderline FDG uptake. 4.  Splenomegaly. 5.  Right lower lobe pulmonary nodule is too small reliably characterize by PET-CT.  Original Report Authenticated By: Rosealee Albee, M.D.    Assessment: FTT Adenopathy Possible thyroid lesion.  Plan: Agree with plan for discharge and follow up in  office with Dr. Carolynne Edouard for review of PET and outpt planning. Feel excisional biopsy of (L)axillary lesion would not be diagnostic.  LOS: 5 days    Iona Coach 04/21/2011

## 2011-04-21 NOTE — Progress Notes (Signed)
12:45,04/21/11 family present for dc instructions. Home meds gone over, prescriptions given. Follow up appointments to be made. Home instructions gone over, emphasis placed on maintaining healthy diet, eating more, call for assistance if weak or dizzy. Patient and family verbalized understanding of instructions.

## 2011-04-21 NOTE — Discharge Summary (Signed)
Admit date: 04/16/2011 Discharge date: 04/21/2011  Primary Care Physician:  Redmond Baseman, MD, MD   Discharge Diagnoses:   Failure to thrive. Hypothyroidism Cervical, axillary lymphadenopathy. Small lung nodule. Thyroid nodule. Sinus tachycardia.  Dementia Anemia of chronic disease Hypertension Diabetes   DISCHARGE MEDICATION: Current Discharge Medication List    START taking these medications   Details  feeding supplement (ENSURE IMMUNE HEALTH) LIQD Take 237 mLs by mouth 3 (three) times daily with meals. Qty: 12 Bottle, Refills: 1      CONTINUE these medications which have CHANGED   Details  levothyroxine (SYNTHROID, LEVOTHROID) 100 MCG tablet Take 1 tablet (100 mcg total) by mouth daily before breakfast. Qty: 30 tablet, Refills: 0      CONTINUE these medications which have NOT CHANGED   Details  amLODipine (NORVASC) 10 MG tablet Take 10 mg by mouth at bedtime.     atorvastatin (LIPITOR) 20 MG tablet Take 20 mg by mouth at bedtime.     donepezil (ARICEPT) 10 MG tablet Take 10 mg by mouth at bedtime.     fenofibrate 160 MG tablet Take 160 mg by mouth every morning.     lovastatin (MEVACOR) 40 MG tablet Take 80 mg by mouth at bedtime. 2 tablets at hs      STOP taking these medications     ciprofloxacin (CIPRO) 500 MG tablet            Consults: Treatment Team:  Josph Macho, MD [facility]   SIGNIFICANT DIAGNOSTIC STUDIES:  Dg Chest 2 View  04/16/2011  *RADIOLOGY REPORT*  Clinical Data: Weakness.  CHEST - 2 VIEW  Comparison: 04/11/2011  Findings: Scattered parenchymal scarring and atelectasis bilaterally.  No evidence of overt edema, pulmonary consolidation or pleural effusion.  Stable cardiomegaly.  Stable degenerative disease of the thoracic spine.  IMPRESSION: No active disease.  Scattered scarring and atelectasis present in both lungs.  Original Report Authenticated By: Reola Calkins, M.D.   Dg Chest 2 View  04/11/2011  *RADIOLOGY  REPORT*  Clinical Data: Dimension, breast cancer, anorexia  CHEST - 2 VIEW  Comparison: None.  Findings: The cardiac silhouette, mediastinum, pulmonary vasculature are within normal limits.  Both lungs are clear. There is no acute bony abnormality.  IMPRESSION: There is no evidence of acute cardiac or pulmonary process.  Original Report Authenticated By: Brandon Melnick, M.D.   Ct Head Wo Contrast  04/11/2011  *RADIOLOGY REPORT*  Clinical Data: Dimension.  CT HEAD WITHOUT CONTRAST  Technique:  Contiguous axial images were obtained from the base of the skull through the vertex without contrast.  Comparison: None  Findings: No acute intracranial abnormality.  Specifically, no hemorrhage, hydrocephalus, mass lesion, acute infarction, or significant intracranial injury.  No acute calvarial abnormality. Minimal chronic small vessel disease in the deep white matter. Visualized paranasal sinuses and mastoids clear.  Orbital soft tissues unremarkable.  IMPRESSION: No acute intracranial abnormality.  Original Report Authenticated By: Cyndie Chime, M.D.   Ct Angio Chest W/cm &/or Wo Cm  04/11/2011  *RADIOLOGY REPORT*  Clinical Data:  Dementia, not eating, weakness, diabetes, hypertension, elevated D-dimer, past history left breast cancer post chemotherapy  CT ANGIOGRAPHY CHEST WITH CONTRAST  Technique:  Multidetector CT imaging of the chest was performed using the standard protocol during bolus administration of intravenous contrast.  Multiplanar CT image reconstructions including MIPs were obtained to evaluate the vascular anatomy.  Contrast: 80mL OMNIPAQUE IOHEXOL 350 MG/ML IV SOLN  Comparison:  None  Findings: Aorta normal caliber  without aneurysm or dissection. Scattered atherosclerotic calcifications aorta and coronary arteries. Visualized portion of upper abdomen unremarkable. Left axillary adenopathy, enlarged node measuring 1.9 x 2.3 cm image 14. No additional thoracic adenopathy. Pulmonary arteries appear  grossly patent. Axial images demonstrate a questionable filling defect in a right lower lobe pulmonary artery though on sagittal and coronal images this appears be due to either a kink in the vessel. No definite evidence of pulmonary embolism. Dependent atelectasis in both lungs. 7 mm diameter nonspecific right lower lobe nodule image 47. Scattered end plate spur formation thoracic spine. No definite acute bony lesions.  Review of the MIP images confirms the above findings.  IMPRESSION: No definite evidence of pulmonary embolism as above. Right lower lobe pulmonary nodule 7 mm diameter. Enlarged left axillary lymph node 2.3 x 1.9 cm. Metastatic disease not excluded in this patient with a history of breast cancer. Recommend comparison to prior outside exams to determine stability of these findings.  Original Report Authenticated By: Lollie Marrow, M.D.   Nm Pet Image Initial (pi) Skull Base To Thigh  04/19/2011  *RADIOLOGY REPORT*  Clinical Data:  Initial treatment strategy for pulmonary nodule.  NUCLEAR MEDICINE PET CT INITIAL (PI) SKULL BASE TO THIGH  Technique:  14.6 mCi F-18 FDG was injected intravenously via the left forearm.  Full-ring PET imaging was performed from the skull base through the mid-thighs 55  minutes after injection.  CT data was obtained and used for attenuation correction and anatomic localization only.  (This was not acquired as a diagnostic CT examination.)  Fasting Blood Glucose:  96  Patient Weight:  158 pounds.  Comparison:  None  Findings: Fluid levels noted within the paranasal sinuses.  Bilateral hypermetabolic cervical lymph nodes identified.  Left level II lymph node measures 1.1 cm and has an SUV max equal to 7.0  Right level II B lymph node measures 1.1 cm and has an SUV max equal to 7.0.  Within the left axilla there is an enlarged node measuring 1.9 cm. The SUV max associated with this lymph node is equal to 1.9.  No hypermetabolic right axillary lymph nodes. Nodule within the  left side of the thyroid bed exhibits increased FDG uptake within SUV max equal to 7.0.  No hypermetabolic mediastinal or hilar lymph nodes.  There is no pericardial or pleural effusion identified.  The 7 mm nodule within the right lower lobe is too small to characterize but does not exhibit any malignant range FDG uptake.  There is no hypermetabolic pulmonary parenchymal nodule or mass identified.  There is no abnormal FDG uptake within the liver parenchyma.  No abnormal liver uptake identified.  The spleen is borderline enlarged measuring 12 cm in craniocaudal dimension.  Both adrenal glands appear normal.  There is no abnormal pancreatic abnormality.  No hypermetabolic upper abdominal lymph nodes identified.  There is a small amount of ascites identified within the abdomen or pelvis.  No hypermetabolic pelvic or inguinal adenopathy.  IMPRESSION:  1.  Bilateral cervical lymph nodes exhibit malignant range FDG uptake.  Cannot rule out metastatic adenopathy. 2.  Hypermetabolic nodule within the left lobe of the thyroid gland.  Recommend further evaluation with thyroid sonography.  3.  Enlarged left axillary lymph node exhibits low level, borderline FDG uptake. 4.  Splenomegaly. 5.  Right lower lobe pulmonary nodule is too small reliably characterize by PET-CT.  Original Report Authenticated By: Rosealee Albee, M.D.     Recent Results (from the past 240 hour(s))  URINE CULTURE     Status: Normal   Collection Time   04/11/11  1:25 PM      Component Value Range Status Comment   Specimen Description URINE, CLEAN CATCH   Final    Special Requests NONE   Final    Setup Time [id number]   Final    Colony Count NO GROWTH   Final    Culture NO GROWTH   Final    Report Status 04/12/2011 FINAL   Final   PATHOLOGIST SMEAR REVIEW     Status: Normal   Collection Time   04/18/11  8:51 PM      Component Value Range Status Comment   Tech Review          Final   URINE CULTURE     Status: Normal   Collection  Time   04/19/11  2:57 AM      Component Value Range Status Comment   Specimen Description URINE, CLEAN CATCH   Final    Special Requests NONE   Final    Setup Time [id number]   Final    Colony Count 40,000 COLONIES/ML   Final    Culture     Final    Value: STAPHYLOCOCCUS SPECIES (COAGULASE NEGATIVE)     Note: RIFAMPIN AND GENTAMICIN SHOULD NOT BE USED AS SINGLE DRUGS FOR TREATMENT OF STAPH INFECTIONS.   Report Status 04/21/2011 FINAL   Final    Organism ID, Bacteria STAPHYLOCOCCUS SPECIES (COAGULASE NEGATIVE)   Final     BRIEF ADMITTING H & P: The patient is a 70 year old female with a history of dementia who presents brought in by her family for failure to thrive. The patient was recently admitted to the hospital and diagnosed with urinary tract infection after presentation to the ED approximately one week ago with similar symptoms. Since discharge home 5 days ago, the patient's family states the patient has not eaten anything and barely drinks anything and is not getting up and walking or carrying for herself. The patient denies any and all symptoms other than lack of appetite and generalized weakness. She denies headache, dizziness, chest pain, shortness of breath, nausea, vomiting, abdominal pain, focal numbness or weakness.  Hospital Course:   Failure to thrive  Concern for malignancy. Patient with history of ductal carcinoma insitus, has left axillary lymphadenopathy. PET scan showed : 1. Bilateral cervical lymph nodes exhibit malignant range FDG uptake. Cannot rule out metastatic adenopathy. 2. Hypermetabolic nodule within the left lobe of the thyroid gland. Recommend further evaluation with thyroid sonography. 3. Enlarged left axillary lymph node exhibits low level, borderline FDG uptake. 4. Splenomegaly. 5. Right lower lobe pulmonary nodule is too small reliably. Surgery consulted for excisional Biopsy. Dr Cherie Ouch does not recommend core biopsy.  Biopsy wont be able to be done until  later next week. Patient wants to go home for holidays.  Discuss case with Dr. Zachery Dakins, he will inform the office that patient needed appointment follow up. I provide [place] surgery numbers to the family. I also provided Dr. Myna Hidalgo office phone number. Patients wants to follow up with Dr Twanna Hy for her care. Urinalysis was negative for recurrent infection. Patient denies abdominal pain. She has decreased appetite. TSH, free T3-T4 was low for these reasons Synthroid dose was increased. Family agree to take patient home with 24-hour supervision.  Dementia  Continue with donepezil.  Recent UTI  Repeated UA normal. Patient received a total of 3 days of ciprofloxacin during this hospitalization.  Anemia :  Low iron , increase ferritin. ?? Chronic diseases.   Hypothyroidism  Continue with synthroid, dose increase to 100 mcg from 75 mcg .  Hypertension : continue with Norvasc.  Left axilary lymphadenopathy,: PET scan showed hypermetabolic nodule. Asked surgery for lymph node excision.  Hypokalemia: Patient received potassium supplements this has resolved. Thrombocytopenia: Resolved. Probably secondary to medication Lovenox and aspirin.       Disposition and Follow-up:  Discharge Orders    Future Orders Please Complete By Expires   Diet general      Increase activity slowly        Follow-up Information    Follow up with Redmond Baseman, MD in 1 week.      Follow up with Josph Macho, MD in 1 week. (Please call to arrange appointment )    Contact information:   [address], [address] [place] [address] [phone]       Follow up with Robyne Askew, MD. Call in 1 week.   Contact information:   [employer] [facility] [address] [place] [address] [phone]           DISCHARGE EXAM:   General: Alert, awake, oriented x3, in no acute distress.  HEENT: No bruits, no goiter.  Heart: Regular rate and  rhythm, without murmurs, rubs, gallops.  Lungs: CTA, bilateral air movement.  Abdomen: Soft, nontender, nondistended, positive bowel sounds.  Neuro: Grossly intact, nonfocal.  Extremities; no edema   Blood pressure 128/76, pulse 119, temperature 99.1 F (37.3 C), temperature source Oral, resp. rate 18, height 5\' 2"  (1.575 m), weight 72.031 kg (158 lb 12.8 oz), SpO2 96.00%.   Basename 04/21/11 0550 04/19/11 0505  NA 137 138  K 3.5 3.1*  CL 105 106  CO2 24 27  GLUCOSE 86 91  BUN 10 13  CREATININE 0.97 1.15*  CALCIUM 9.8 9.6  MG -- --  PHOS -- --    Basename 04/21/11 0550 04/19/11 0505  WBC 9.4 5.8  NEUTROABS -- --  HGB 11.0* 10.1*  HCT 33.7* 30.7*  MCV 94.9 95.6  PLT 178 113*    Signed: REGALADO,BELKYS M.D. 04/21/2011, 11:12 AM

## 2011-04-24 ENCOUNTER — Encounter: Payer: Self-pay | Admitting: Hematology & Oncology

## 2011-04-24 NOTE — Progress Notes (Signed)
Patient's daughter-in-law, [name], called to make new patient appointment.  Dr. Myna Hidalgo saw patient in consultation in the hospital on 04-19-11.  I took a message to Dr. Myna Hidalgo to advise [place] when to set up new patient appointment.  Call [place] at [phone] with appointment.

## 2011-05-14 ENCOUNTER — Other Ambulatory Visit (INDEPENDENT_AMBULATORY_CARE_PROVIDER_SITE_OTHER): Payer: Self-pay | Admitting: General Surgery

## 2011-05-15 ENCOUNTER — Encounter (HOSPITAL_COMMUNITY): Payer: Self-pay | Admitting: Vascular Surgery

## 2011-05-15 ENCOUNTER — Encounter (INDEPENDENT_AMBULATORY_CARE_PROVIDER_SITE_OTHER): Payer: Medicare Other | Admitting: General Surgery

## 2011-05-15 ENCOUNTER — Encounter (HOSPITAL_COMMUNITY): Payer: Self-pay

## 2011-05-15 ENCOUNTER — Telehealth (INDEPENDENT_AMBULATORY_CARE_PROVIDER_SITE_OTHER): Payer: Self-pay | Admitting: General Surgery

## 2011-05-15 NOTE — Consult Note (Addendum)
Anesthesia:  Patient is a 71 year old female scheduled for left axillary LN biopsy on 04/1811.  This is posted to be done under [facility].  She is scheduled to be a same day work-up.  The PAT nurse called and obtained her health history this afternoon from the patient's daughter as [name] has dementia.  Other history includes DM (although no DM medications are currently listed and PCP lists last AIC as 5.0), HTN, HLD, hypothyroidism, and right breast CA in 2003 s/p lumpectomy and radiation.  Her CXR from 04/16/11 showed no active disease. Scattered scarring and atelectasis present in  both lungs.  A CTA of the chest done on 04/11/11 showed: No definite evidence of pulmonary embolism as above.  Right lower lobe pulmonary nodule 7 mm diameter.  Enlarged left axillary lymph node 2.3 x 1.9 cm.  Metastatic disease not excluded in this patient with a history of breast cancer.  She ultimately had a PET scan that showed: 1. Bilateral cervical lymph nodes exhibit malignant range FDG uptake. Cannot rule out metastatic adenopathy.  2. Hypermetabolic nodule within the left lobe of the thyroid  gland. Recommend further evaluation with thyroid sonography.  3. Enlarged left axillary lymph node exhibits low level,  borderline FDG uptake.  4. Splenomegaly.  5. Right lower lobe pulmonary nodule is too small reliably  characterize by PET-CT.  She was seen by Dr. Myna Hidalgo, Dr. Zachery Dakins, and Dr. Carolynne Edouard in December.  According to her 05/02/11 PCP records, Hospice was also considered at some point due to patients failure to thrive, weight loss, and concern for possible metastatic disease, but is since being reconsidered.  Dr. Carolynne Edouard has now recommended a left axillary LN biopsy.   EKG done in November and December of 2012 showed SR with a new left BBB since 2003.  According to [facility] notes, patient was referred to Cardiology on 03/19/11 due to her abnormal EKG, but patient's daughter reports  they never followed through with this.   I review above with Anesthesiologist Dr. Krista Blue who agrees that patient should have a Cardiology evaluation preoperatively to determine if any additional testing warranted.  [facility] is now closed, so I will notify their office tomorrow.  Addendum:  05/16/11 1140  I spoke with Jeoffrey Massed Triage Nurse at [facility], the patient is going to be seen at [facility] then rescheduled for her LN biopsy once cleared.

## 2011-05-15 NOTE — Progress Notes (Signed)
Forwarded EKG from 03/2011 to anesthesia for review.  Spoke with Mrs. Holstad daughter in law and care giver, states patient does not have a cardiologist.

## 2011-05-15 NOTE — Progress Notes (Signed)
Contacted Misty Stanley with Dr. Nash Dimmer office, patients primary care doctor, requested last office visit notes and  EKG if available

## 2011-05-15 NOTE — Telephone Encounter (Signed)
I SPOKE WITH Kristen Wheeler ON Monday 05-13-11 RE Kristen Primiano APPOINTMENT THIS WEEK WITH DR. Carolynne Edouard. SHE STATED THAT SHE THOUGHT THE BX WOULD BE DONE AT THE APPOINTMENT. I EXPLAINED TO HER THAT THE VISIT WAS A CONSULTATION FOR SURGERY AND THAT THE BIOPSY/EXCISION WOULD BE SCHEDULED AT A LATER DATE. I TOLD HER THAT I WOULD SPEAK WITH DR. TOTH TO SEE IF HE WOULD ENTER ORDERS INTO EPIC FOR SURGERY TO BE SCHEDULED WITHOUT Kristen Wheeler HAVING TO COME TO OFFICE.    I CALLED Kristen Wheeler ON Tuesday 05-14-11 AFTER REVIEWING THE CASE WITH DR. Carolynne Edouard. HE SAID HE WOULD ENTER ORDERS INTO EPIC AND TRY TO HAVE SURGERY SCHEDULED FOR THIS WEEK. I CALLED Kristen Wheeler ON 05-14-11 AND INFORMED HER THAT Kristen Wheeler DID NOT NEED TO COME FOR AN OFFICE VISIT. SHE SEEMED PLEASED THAT Kristen Wheeler DID NOT HAVE TO TRAVEL TO OUR OFFICE TO BE SEEN. I ALSO INFORMED HER THAT ORDERS WERE TO BE ENTERED AND THAT THE PLAN WAS TO HAVE SURGERY THIS WEEK/ GY

## 2011-05-16 ENCOUNTER — Telehealth (INDEPENDENT_AMBULATORY_CARE_PROVIDER_SITE_OTHER): Payer: Self-pay | Admitting: General Surgery

## 2011-05-16 ENCOUNTER — Institutional Professional Consult (permissible substitution): Payer: Self-pay | Admitting: Cardiology

## 2011-05-16 ENCOUNTER — Encounter: Payer: Self-pay | Admitting: Cardiology

## 2011-05-16 ENCOUNTER — Encounter (HOSPITAL_COMMUNITY): Payer: Self-pay | Admitting: Pharmacy Technician

## 2011-05-16 ENCOUNTER — Telehealth: Payer: Self-pay | Admitting: Cardiology

## 2011-05-16 NOTE — Telephone Encounter (Signed)
ALLISON PA FROM SHORT STAY CALLED THIS AM RE CARDIAC CLEARANCE NEEDED ON MS [place] DUE TO EKG SHOWING BUNDLE BRANCH BLOCK. PT'S IS SCHEDULED FOR EXCISION OF LYMPH NODE TOMORROW. I CALLED AND SPOKE WITH DR. SINGER/ANESTHESIOLOGIST [phone] AND HE CONFIRMED THAT MS Nolt NEEDED CARDIAC CLEARANCE FOR HER ABNORMAL EKG. I THEN CALLED [facility]/DR. WONGS OFFICE RE NAME OF CARDIOLOGIST MS Deweese WAS REFERRED TO IN November OF 2012/ NURSE CHECKED PT'S CHART AND INDICATED THAT DR. JEFFREY KATZ WAS THE CARDIOLOGIST. I THEN CALLED DR. KATZ OFFICE TO SEE IF MS Plude COULD BE SEEN TODAY FOR CLEARANCE. AN APPOINTMENT WAS AVAILABLE AT 3PM TODAY. I OK'D THAT APPOINTMENT FOR TODAY. I GAVE THE APPOINTMENT INFORMATION TO OUR CLINIC MANAGER DEBI GREGORY AND SHE CONTACTED PT'S DAUGHTER-IN-LAW.

## 2011-05-16 NOTE — Telephone Encounter (Signed)
New Msg: Amy from Dr. Gustavo Lah office calling wanting to speak with Dr. Antoine Poche regarding this pt. Please return call to discuss further.   Alt #: [phone]

## 2011-05-16 NOTE — Telephone Encounter (Signed)
Called Kristen Wheeler back to let her know pt didn't show for her appt today at 3PM .  Per pt's daughter they did not know she had an appointment.  Will contact scheduling to have pt rescheduled for surgical clearance.

## 2011-05-16 NOTE — Telephone Encounter (Signed)
GEISELA FROM DR. KATZ'S OFFICE CALLED TO LET ME KNOW THAT NO ONE CALLED FROM Kristen Wheeler FAMILY TO CX TODAY'S APPT AND R/S FOR CARDIAC CLEARANCE./GY

## 2011-05-20 ENCOUNTER — Telehealth: Payer: Self-pay | Admitting: Cardiovascular Disease

## 2011-05-20 ENCOUNTER — Telehealth: Payer: Self-pay | Admitting: *Deleted

## 2011-05-20 NOTE — Telephone Encounter (Signed)
Did discuss with Pam and Dr Antoine Poche does not have anything tomorrow

## 2011-05-20 NOTE — Progress Notes (Addendum)
I spoke with patient's daughter Swayzie Choate and was informed that patient has not been cleared by cardiologist for surgery.  Pt had an appt on Jan 17th and had to cancel due to inclement weather.  Pts daughter said that she is waiting on a call back from Cardiologist office for a new appt., but was told it might be in Feb.  I called Dr Caralyn Guile office and spoke with Amy and told her that patient has not been cleared for OR.  Amy will notify Dr. Carolynne Edouard.

## 2011-05-20 NOTE — Telephone Encounter (Signed)
New Problem:    Patient's daughter called because her mother is scheduled to have surgery for a Bundle branch block on Wed 05/22/11 and needs to have a surgical clearance OV before they will operate.  Patient had an appointment on 05/16/11 with [facility] but had to cancel due to inclement weather. Please call back.

## 2011-05-20 NOTE — Telephone Encounter (Signed)
05/20/11--pt's daughter calling stating needs pre op appoint for surgical clearence --pt was supposed to have nodule removed by [place] [facility] on 05/22/11--daughter states missed last appoint due to bad weather--advised daughter to bring mother here on 05/23/11 at 2:30 for pre op visit clearence --we have already cancelled surgery at [facility] and pt's   daughter was told  she will have to reschedule the surgery at [facility]--daughter agrees--nt

## 2011-05-20 NOTE — Telephone Encounter (Signed)
New Problem:    Patient's Daughter called because she is having surgery for a bundle branch block on Wed 05/22/11 and needs to be seen for a sugical clearance OV before they will operate.  Patient had an appointment on 05/16/11 with Dr. Antoine Poche, but had to cancel due to in-climate weather. Please call back.

## 2011-05-22 ENCOUNTER — Encounter (HOSPITAL_COMMUNITY): Admission: RE | Payer: Self-pay | Source: Ambulatory Visit

## 2011-05-22 ENCOUNTER — Ambulatory Visit (HOSPITAL_COMMUNITY): Admission: RE | Admit: 2011-05-22 | Payer: Medicare Other | Source: Ambulatory Visit | Admitting: General Surgery

## 2011-05-22 SURGERY — LYMPH NODE BIOPSY
Anesthesia: General | Site: Axilla | Laterality: Left

## 2011-05-23 ENCOUNTER — Institutional Professional Consult (permissible substitution): Payer: Self-pay | Admitting: Cardiology

## 2011-05-23 ENCOUNTER — Encounter: Payer: Self-pay | Admitting: Internal Medicine

## 2011-05-23 ENCOUNTER — Ambulatory Visit (INDEPENDENT_AMBULATORY_CARE_PROVIDER_SITE_OTHER): Payer: Medicare Other | Admitting: Internal Medicine

## 2011-05-23 VITALS — BP 135/80 | Ht 65.0 in | Wt 161.0 lb

## 2011-05-23 DIAGNOSIS — Z01818 Encounter for other preprocedural examination: Secondary | ICD-10-CM

## 2011-05-23 DIAGNOSIS — E785 Hyperlipidemia, unspecified: Secondary | ICD-10-CM

## 2011-05-23 DIAGNOSIS — I514 Myocarditis, unspecified: Secondary | ICD-10-CM

## 2011-05-23 NOTE — Patient Instructions (Signed)
Your physician has requested that you have a lexiscan myoview. For further information please visit [email or web address]. Please follow instruction sheet, as given.  Will call you with results. 

## 2011-05-23 NOTE — Progress Notes (Signed)
HPI Patient is a 71 year old with no known history of CAD.  She is being evaluated for surgery. She is not the best historian.  She is not active at all.  Does get SOB with activity.  No significant CP.  No PND> Being evaluated for surg of lung nodule.  Allergies  Allergen Reactions  . Penicillins     Advised was allergic to penicillin early in life.    Current Outpatient Prescriptions  Medication Sig Dispense Refill  . amLODipine (NORVASC) 10 MG tablet Take 10 mg by mouth at bedtime.       Marland Kitchen atorvastatin (LIPITOR) 20 MG tablet Take 20 mg by mouth at bedtime.       . donepezil (ARICEPT) 10 MG tablet Take 10 mg by mouth at bedtime.       . feeding supplement (ENSURE IMMUNE HEALTH) LIQD Take 237 mLs by mouth 3 (three) times daily with meals.  12 Bottle  1  . fenofibrate 160 MG tablet Take 160 mg by mouth every morning.       Marland Kitchen levothyroxine (SYNTHROID, LEVOTHROID) 100 MCG tablet Take 1 tablet (100 mcg total) by mouth daily before breakfast.  30 tablet  0  . lovastatin (MEVACOR) 40 MG tablet Take 80 mg by mouth at bedtime. 2 tablets at hs        Past Medical History  Diagnosis Date  . Dementia   . Thyroid disease   . Diabetes mellitus   . Hyperlipidemia   . Breast cancer   . Hypertension     primary Dr. Modesto Charon, saw last Oct 2012,    Past Surgical History  Procedure Date  . Breast surgery   . Appendectomy     Family History  Problem Relation Age of Onset  . Diabetes Mother   . Coronary artery disease Mother   . Coronary artery disease Father     History   Social History  . Marital Status: Married    Spouse Name: N/A    Number of Children: N/A  . Years of Education: N/A   Occupational History  . Not on file.   Social History Main Topics  . Smoking status: Never Smoker   . Smokeless tobacco: Not on file  . Alcohol Use: No  . Drug Use: No  . Sexually Active:    Other Topics Concern  . Not on file   Social History Narrative  . No narrative on file     Review of Systems:  All systems reviewed.  They are negative to the above problem except as previously stated.  Vital Signs: BP 135/80  Ht 5\' 5"  (1.651 m)  Wt 161 lb (73.029 kg)  BMI 26.79 kg/m2  Physical Exam  Patient is in NAD  HEENT:  Normocephalic, atraumatic. EOMI, PERRLA.  Neck: JVP is normal. No thyromegaly. No bruits.  Lungs: clear to auscultation. No rales no wheezes.  Heart: Regular rate and rhythm. Normal S1, S2. No S3.   No significant murmurs. PMI not displaced.  Abdomen:  Supple, nontender. Normal bowel sounds. No masses. No hepatomegaly.  Extremities:   Good distal pulses throughout. No lower extremity edema.  Musculoskeletal :moving all extremities.  Neuro:   alert and oriented x3.  CN II-XII grossly intact.  EKG:  12/24:  Sinus tach.  109  LBBB   Assessment and Plan:

## 2011-05-26 DIAGNOSIS — Z01818 Encounter for other preprocedural examination: Secondary | ICD-10-CM | POA: Insufficient documentation

## 2011-05-26 NOTE — Assessment & Plan Note (Signed)
Patient is not active.  Gives out with activity.  EKG with LBBB.  I would recomm lexiscan myoview to r/o signif ischemia as part of risk stratification.

## 2011-05-26 NOTE — Assessment & Plan Note (Signed)
Keep on statin.  Will need to be followed.  Needs to check meds  Question which one taking.

## 2011-05-28 ENCOUNTER — Ambulatory Visit (HOSPITAL_COMMUNITY): Payer: Medicare Other | Attending: Cardiology | Admitting: Radiology

## 2011-05-28 ENCOUNTER — Telehealth (INDEPENDENT_AMBULATORY_CARE_PROVIDER_SITE_OTHER): Payer: Self-pay | Admitting: General Surgery

## 2011-05-28 VITALS — BP 103/78 | Ht 65.0 in | Wt 160.0 lb

## 2011-05-28 DIAGNOSIS — I10 Essential (primary) hypertension: Secondary | ICD-10-CM | POA: Insufficient documentation

## 2011-05-28 DIAGNOSIS — R0609 Other forms of dyspnea: Secondary | ICD-10-CM | POA: Insufficient documentation

## 2011-05-28 DIAGNOSIS — I514 Myocarditis, unspecified: Secondary | ICD-10-CM | POA: Insufficient documentation

## 2011-05-28 DIAGNOSIS — I447 Left bundle-branch block, unspecified: Secondary | ICD-10-CM

## 2011-05-28 DIAGNOSIS — E785 Hyperlipidemia, unspecified: Secondary | ICD-10-CM | POA: Insufficient documentation

## 2011-05-28 DIAGNOSIS — E119 Type 2 diabetes mellitus without complications: Secondary | ICD-10-CM | POA: Insufficient documentation

## 2011-05-28 DIAGNOSIS — Z01810 Encounter for preprocedural cardiovascular examination: Secondary | ICD-10-CM | POA: Insufficient documentation

## 2011-05-28 DIAGNOSIS — Z8249 Family history of ischemic heart disease and other diseases of the circulatory system: Secondary | ICD-10-CM | POA: Insufficient documentation

## 2011-05-28 DIAGNOSIS — R0602 Shortness of breath: Secondary | ICD-10-CM | POA: Insufficient documentation

## 2011-05-28 DIAGNOSIS — R0989 Other specified symptoms and signs involving the circulatory and respiratory systems: Secondary | ICD-10-CM | POA: Insufficient documentation

## 2011-05-28 DIAGNOSIS — J984 Other disorders of lung: Secondary | ICD-10-CM | POA: Insufficient documentation

## 2011-05-28 DIAGNOSIS — R002 Palpitations: Secondary | ICD-10-CM | POA: Insufficient documentation

## 2011-05-28 DIAGNOSIS — R9431 Abnormal electrocardiogram [ECG] [EKG]: Secondary | ICD-10-CM | POA: Insufficient documentation

## 2011-05-28 MED ORDER — ADENOSINE (DIAGNOSTIC) 3 MG/ML IV SOLN
0.5600 mg/kg | Freq: Once | INTRAVENOUS | Status: AC
  Administered 2011-05-28: 40.8 mg via INTRAVENOUS

## 2011-05-28 MED ORDER — TECHNETIUM TC 99M TETROFOSMIN IV KIT
10.0000 | PACK | Freq: Once | INTRAVENOUS | Status: AC | PRN
  Administered 2011-05-28: 10 via INTRAVENOUS

## 2011-05-28 MED ORDER — TECHNETIUM TC 99M TETROFOSMIN IV KIT
30.0000 | PACK | Freq: Once | INTRAVENOUS | Status: AC | PRN
  Administered 2011-05-28: 30 via INTRAVENOUS

## 2011-05-28 NOTE — Progress Notes (Signed)
[facility] SITE 3 NUCLEAR MED [address] [place] [address] [phone]  Cardiology Nuclear Med Kristen Wheeler is a 71 y.o. female [id number] [birth year]   Nuclear Med Background Indication for Stress Test:  Evaluation for Ischemia and Pending Surgical Clearance: Lung nodule History:  Abnormal EKG Cardiac Risk Factors: Family History - CAD, Hypertension, Lipids and NIDDM  Symptoms:  DOE, Palpitations and SOB   Nuclear Pre-Procedure Caffeine/Decaff Intake:  None NPO After: 10:00pm   Lungs:  clear IV 0.9% NS with Angio Cath:  22g  IV Site: L Antecubital  IV Started by:  Stanton Kidney, EMT-P  Chest Size (in):  38 Cup Size: C  Height: 5\' 5"  (1.651 m)  Weight:  160 lb (72.576 kg)  BMI:  Body mass index is 26.63 kg/(m^2). Tech Comments:  NA    Nuclear Med Study 1 or 2 day study: 1 day  Stress Test Type:  Adenosine  Reading MD: Olga Millers, MD  Order Authorizing Provider:  P.Ross  Resting Radionuclide: Technetium 42m Tetrofosmin  Resting Radionuclide Dose: 11.0 mCi   Stress Radionuclide:  Technetium 68m Tetrofosmin  Stress Radionuclide Dose: 33.0 mCi           Stress Protocol Rest HR: 103 Stress HR: 104  Rest BP: 103/78 Stress BP: 132/60  Exercise Time (min): n/a METS: n/a   Predicted Max HR: 150 bpm % Max HR: 69.33 bpm Rate Pressure Product: 21308   Dose of Adenosine (mg):  40.7 Dose of Lexiscan: n/a mg  Dose of Atropine (mg): n/a Dose of Dobutamine: n/a mcg/kg/min (at max HR)  Stress Test Technologist: Milana Na, EMT-P  Nuclear Technologist:  Domenic Polite, CNMT     Rest Procedure:  Myocardial perfusion imaging was performed at rest 45 minutes following the intravenous administration of Technetium 63m Tetrofosmin. Rest ECG: NSR-LBBB  Stress Procedure:  The patient received IV adenosine at 140 mcg/kg/min for 4 minutes.  2nd degree AVB with infusion.There were no significant changes, chest tightness, and freq pvcs with  infusion.  Technetium 61m Tetrofosmin was injected at the 2 minute mark and quantitative spect images were obtained after a 45 minute delay. Stress ECG: Uninteretable due to baseline LBBB  QPS Raw Data Images:  Acquisition technically good; evidence of LVE. Stress Images:  There is decreased uptake in the anterior wall and apex. Rest Images:  There is decreased uptake in the anterior wall and apex; anterior defect slightly less prominent compared to the stress images. Subtraction (SDS):  These findings are consistent with previous infarct vs changes related to intermittent LBBB; cannot R/O mild anterior ischemia. Transient Ischemic Dilatation (Normal <1.22):  1.01 Lung/Heart Ratio (Normal <0.45):  0.27  Quantitative Gated Spect Images QGS EDV:  167 ml QGS ESV:  110 ml QGS cine images:  Global hypokinesis. QGS EF: 34%  Impression Exercise Capacity:  Adenosine study with no exercise. BP Response:  Hypotensive blood pressure response. Clinical Symptoms:  There is chest tightness. ECG Impression:   EKG uninterpretable due to intermittent LBBB. Comparison with Prior Nuclear Study: No images to compare  Overall Impression:  Abnormal stress nuclear study with a small predominantly fixed distal anterior and apical defect consistent with infarct (vs changes related to LBBB); very mild reversibility in the distal anterior wall; cannot exclude very mild ischemia.  Olga Millers

## 2011-05-28 NOTE — Telephone Encounter (Signed)
I CONTACTED DR. KATZ'S OFFICE RE CARDIAC WORK-UP/ [place] IS SCHEDULED FOR A STRESS TEST TODAY TO COMPLETE WORK-UP/ DR. TOTH AWARE/GY

## 2011-05-29 ENCOUNTER — Telehealth: Payer: Self-pay | Admitting: Internal Medicine

## 2011-05-29 NOTE — Telephone Encounter (Signed)
Called patient's daughter in law and gave prelim stress test results. Advised Dr.Ross needs to review results to see if she can be cleared for surgery. Advised will call back within the next 2 days.

## 2011-05-29 NOTE — Telephone Encounter (Signed)
New Msg: Pt daughter calling wanting to know results of pt stress test. Please return pt daughter call to discuss further.

## 2011-05-30 NOTE — Telephone Encounter (Signed)
FU Call: Pt daughter calling back to speak with nurse/md regarding pt stress test.  Pt daughter in law was advised MD/nurse were not in office today; and that Dr. Tenny Craw will be in 05/31/11 in the AM.  Please return pt call to discuss further ASAP.

## 2011-05-30 NOTE — Telephone Encounter (Signed)
Will forward to Jackie. 

## 2011-05-31 ENCOUNTER — Other Ambulatory Visit: Payer: Self-pay | Admitting: *Deleted

## 2011-05-31 DIAGNOSIS — R0602 Shortness of breath: Secondary | ICD-10-CM

## 2011-05-31 NOTE — Telephone Encounter (Signed)
See result note from 2/1.

## 2011-06-05 ENCOUNTER — Ambulatory Visit (HOSPITAL_COMMUNITY): Payer: Medicare Other | Attending: Cardiovascular Disease | Admitting: Radiology

## 2011-06-05 DIAGNOSIS — R0609 Other forms of dyspnea: Secondary | ICD-10-CM | POA: Insufficient documentation

## 2011-06-05 DIAGNOSIS — I447 Left bundle-branch block, unspecified: Secondary | ICD-10-CM | POA: Insufficient documentation

## 2011-06-05 DIAGNOSIS — I059 Rheumatic mitral valve disease, unspecified: Secondary | ICD-10-CM

## 2011-06-05 DIAGNOSIS — R0989 Other specified symptoms and signs involving the circulatory and respiratory systems: Secondary | ICD-10-CM | POA: Insufficient documentation

## 2011-06-05 DIAGNOSIS — E785 Hyperlipidemia, unspecified: Secondary | ICD-10-CM | POA: Insufficient documentation

## 2011-06-05 DIAGNOSIS — I10 Essential (primary) hypertension: Secondary | ICD-10-CM | POA: Insufficient documentation

## 2011-06-05 DIAGNOSIS — R0602 Shortness of breath: Secondary | ICD-10-CM

## 2011-06-05 DIAGNOSIS — R9430 Abnormal result of cardiovascular function study, unspecified: Secondary | ICD-10-CM | POA: Insufficient documentation

## 2011-06-07 ENCOUNTER — Telehealth: Payer: Self-pay | Admitting: Internal Medicine

## 2011-06-07 NOTE — Telephone Encounter (Signed)
New Msg: Pt daughter calling wanting to know results of pt ultrasound. Please return call to discuss further.

## 2011-06-07 NOTE — Telephone Encounter (Signed)
Called patient daughter in law [name]) with results of echo and advised that Dr.Ross cleared her for surgery.

## 2011-06-13 ENCOUNTER — Telehealth (INDEPENDENT_AMBULATORY_CARE_PROVIDER_SITE_OTHER): Payer: Self-pay | Admitting: General Surgery

## 2011-06-13 NOTE — Telephone Encounter (Signed)
A PHONE MESSAGE WAS FORWARDED TO ME FROM DEBBIE EVERHART FROM JACQUELINE/ DAUGHTER-IN-LAW OF [facility]. SHE INDICATED THAT HEART TESTING HAD BEEN COMPLETE AND SHE WAS CALLING TO CK ON SURGERY RESCHEDULING./ I CHECKED CORRESPONDENCE THAT DR. TOTH SIGNED AND FOUND NOTE FROM DR. PAULA V ROSS RE CLEARANCE. I NOTIFIED AMY IN SCHEDULING THAT MS [name] CAN BE RESCHEDULED FOR LYMPH NODE EXCISION/ JACQUELINE ALSO NOTIFIED/GY

## 2011-06-14 ENCOUNTER — Encounter (HOSPITAL_COMMUNITY): Payer: Self-pay | Admitting: Respiratory Therapy

## 2011-06-19 ENCOUNTER — Encounter (HOSPITAL_COMMUNITY): Payer: Self-pay | Admitting: *Deleted

## 2011-06-19 MED ORDER — VANCOMYCIN HCL IN DEXTROSE 1-5 GM/200ML-% IV SOLN
1000.0000 mg | INTRAVENOUS | Status: AC
  Administered 2011-06-20: 1000 mg via INTRAVENOUS
  Filled 2011-06-19: qty 200

## 2011-06-19 MED ORDER — CHLORHEXIDINE GLUCONATE 4 % EX LIQD
1.0000 "application " | Freq: Once | CUTANEOUS | Status: DC
  Filled 2011-06-19: qty 15

## 2011-06-20 ENCOUNTER — Ambulatory Visit (HOSPITAL_COMMUNITY)
Admission: RE | Admit: 2011-06-20 | Discharge: 2011-06-20 | Disposition: A | Discharge status: Home / Self Care | Payer: Medicare Other | Source: Ambulatory Visit | Attending: General Surgery | Admitting: General Surgery

## 2011-06-20 ENCOUNTER — Other Ambulatory Visit (INDEPENDENT_AMBULATORY_CARE_PROVIDER_SITE_OTHER): Payer: Self-pay | Admitting: General Surgery

## 2011-06-20 ENCOUNTER — Encounter (HOSPITAL_COMMUNITY): Payer: Self-pay | Admitting: *Deleted

## 2011-06-20 ENCOUNTER — Encounter (HOSPITAL_COMMUNITY)
Admission: RE | Disposition: A | Discharge status: Home / Self Care | Payer: Self-pay | Source: Ambulatory Visit | Attending: General Surgery

## 2011-06-20 ENCOUNTER — Encounter (HOSPITAL_COMMUNITY): Payer: Self-pay | Admitting: Anesthesiology

## 2011-06-20 ENCOUNTER — Ambulatory Visit (HOSPITAL_COMMUNITY): Payer: Medicare Other | Admitting: Anesthesiology

## 2011-06-20 DIAGNOSIS — D1739 Benign lipomatous neoplasm of skin and subcutaneous tissue of other sites: Secondary | ICD-10-CM | POA: Insufficient documentation

## 2011-06-20 DIAGNOSIS — R599 Enlarged lymph nodes, unspecified: Secondary | ICD-10-CM | POA: Insufficient documentation

## 2011-06-20 DIAGNOSIS — F0390 Unspecified dementia without behavioral disturbance: Secondary | ICD-10-CM | POA: Insufficient documentation

## 2011-06-20 DIAGNOSIS — I10 Essential (primary) hypertension: Secondary | ICD-10-CM | POA: Insufficient documentation

## 2011-06-20 DIAGNOSIS — D360 Benign neoplasm of lymph nodes: Secondary | ICD-10-CM

## 2011-06-20 DIAGNOSIS — E785 Hyperlipidemia, unspecified: Secondary | ICD-10-CM | POA: Insufficient documentation

## 2011-06-20 DIAGNOSIS — R634 Abnormal weight loss: Secondary | ICD-10-CM | POA: Insufficient documentation

## 2011-06-20 DIAGNOSIS — E119 Type 2 diabetes mellitus without complications: Secondary | ICD-10-CM | POA: Insufficient documentation

## 2011-06-20 DIAGNOSIS — R630 Anorexia: Secondary | ICD-10-CM | POA: Insufficient documentation

## 2011-06-20 LAB — BASIC METABOLIC PANEL
Calcium: 10.9 mg/dL — ABNORMAL HIGH (ref 8.4–10.5)
GFR calc Af Amer: 45 mL/min — ABNORMAL LOW (ref >90)
GFR calc non Af Amer: 39 mL/min — ABNORMAL LOW (ref >90)
Sodium: 140 mEq/L (ref 135–145)

## 2011-06-20 LAB — CBC
MCH: 30.8 pg (ref 26.0–34.0)
Platelets: 181 10*3/uL (ref 150–400)
RBC: 3.02 MIL/uL — ABNORMAL LOW (ref 3.87–5.11)
WBC: 5.1 10*3/uL (ref 4.0–10.5)

## 2011-06-20 LAB — SURGICAL PCR SCREEN
MRSA, PCR: NEGATIVE
Staphylococcus aureus: POSITIVE — AB

## 2011-06-20 SURGERY — AXILLARY LYMPH NODE BIOPSY
Anesthesia: General | Site: Axilla | Laterality: Left | Wound class: Clean

## 2011-06-20 MED ORDER — LACTATED RINGERS IV SOLN
INTRAVENOUS | Status: DC
  Administered 2011-06-20: 08:00:00 via INTRAVENOUS

## 2011-06-20 MED ORDER — PHENYLEPHRINE HCL 10 MG/ML IJ SOLN
10.0000 mg | INTRAVENOUS | Status: DC | PRN
  Administered 2011-06-20: 10 ug/min via INTRAVENOUS

## 2011-06-20 MED ORDER — BUPIVACAINE LIPOSOME 1.3 % IJ SUSP
20.0000 mL | INTRAMUSCULAR | Status: DC
  Filled 2011-06-20: qty 20

## 2011-06-20 MED ORDER — ONDANSETRON HCL 4 MG/2ML IJ SOLN: 4.0000 mg | Freq: Once | INTRAMUSCULAR | Status: DC | PRN

## 2011-06-20 MED ORDER — ONDANSETRON HCL 4 MG/2ML IJ SOLN
INTRAMUSCULAR | Status: DC | PRN
  Administered 2011-06-20: 4 mg via INTRAVENOUS

## 2011-06-20 MED ORDER — HYDROCODONE-ACETAMINOPHEN 5-325 MG PO TABS: 1.0000 | ORAL_TABLET | Freq: Four times a day (QID) | ORAL | Status: AC | PRN

## 2011-06-20 MED ORDER — LIDOCAINE HCL (CARDIAC) 20 MG/ML IV SOLN
INTRAVENOUS | Status: DC | PRN
  Administered 2011-06-20: 10 mg via INTRAVENOUS

## 2011-06-20 MED ORDER — 0.9 % SODIUM CHLORIDE (POUR BTL) OPTIME
TOPICAL | Status: DC | PRN
  Administered 2011-06-20: 1000 mL

## 2011-06-20 MED ORDER — FENTANYL CITRATE 0.05 MG/ML IJ SOLN
INTRAMUSCULAR | Status: DC | PRN
  Administered 2011-06-20: 100 ug via INTRAVENOUS

## 2011-06-20 MED ORDER — PHENYLEPHRINE HCL 10 MG/ML IJ SOLN
INTRAMUSCULAR | Status: DC | PRN
  Administered 2011-06-20: 80 ug via INTRAVENOUS

## 2011-06-20 MED ORDER — HYDROMORPHONE HCL PF 1 MG/ML IJ SOLN: 0.2500 mg | INTRAMUSCULAR | Status: DC | PRN

## 2011-06-20 MED ORDER — PROPOFOL 10 MG/ML IV EMUL
INTRAVENOUS | Status: DC | PRN
  Administered 2011-06-20: 100 mg via INTRAVENOUS

## 2011-06-20 MED ORDER — BUPIVACAINE-EPINEPHRINE 0.5% -1:200000 IJ SOLN
INTRAMUSCULAR | Status: DC | PRN
  Administered 2011-06-20: 6 mL

## 2011-06-20 MED ORDER — BUPIVACAINE LIPOSOME 1.3 % IJ SUSP
INTRAMUSCULAR | Status: DC | PRN
  Administered 2011-06-20: 3 mL

## 2011-06-20 MED ORDER — MUPIROCIN 2 % EX OINT
TOPICAL_OINTMENT | CUTANEOUS | Status: AC
  Filled 2011-06-20: qty 22

## 2011-06-20 MED ORDER — LACTATED RINGERS IV SOLN
INTRAVENOUS | Status: DC | PRN
  Administered 2011-06-20 (×2): via INTRAVENOUS

## 2011-06-20 SURGICAL SUPPLY — 39 items
ADH SKN CLS APL DERMABOND .7 (GAUZE/BANDAGES/DRESSINGS) ×1
BLADE SURG 15 STRL LF DISP TIS (BLADE) ×1 IMPLANT
BLADE SURG 15 STRL SS (BLADE) ×2
CANISTER SUCTION 2500CC (MISCELLANEOUS) IMPLANT
CHLORAPREP W/TINT 10.5 ML (MISCELLANEOUS) ×2 IMPLANT
CLOTH BEACON ORANGE TIMEOUT ST (SAFETY) ×2 IMPLANT
CONT SPEC 4OZ CLIKSEAL STRL BL (MISCELLANEOUS) ×3 IMPLANT
COVER SURGICAL LIGHT HANDLE (MISCELLANEOUS) ×2 IMPLANT
DECANTER SPIKE VIAL GLASS SM (MISCELLANEOUS) ×2 IMPLANT
DERMABOND ADVANCED (GAUZE/BANDAGES/DRESSINGS) ×1
DERMABOND ADVANCED .7 DNX12 (GAUZE/BANDAGES/DRESSINGS) ×1 IMPLANT
DRAPE PED LAPAROTOMY (DRAPES) ×2 IMPLANT
DRAPE UTILITY 15X26 W/TAPE STR (DRAPE) ×2 IMPLANT
ELECT CAUTERY BLADE 6.4 (BLADE) ×2 IMPLANT
ELECT COATED BLADE 2.86 ST (ELECTRODE) ×2 IMPLANT
ELECT REM PT RETURN 9FT ADLT (ELECTROSURGICAL) ×2
ELECTRODE REM PT RTRN 9FT ADLT (ELECTROSURGICAL) ×1 IMPLANT
GAUZE SPONGE 4X4 16PLY XRAY LF (GAUZE/BANDAGES/DRESSINGS) ×2 IMPLANT
GLOVE BIO SURGEON STRL SZ7.5 (GLOVE) ×2 IMPLANT
GOWN STRL NON-REIN LRG LVL3 (GOWN DISPOSABLE) ×4 IMPLANT
KIT BASIN OR (CUSTOM PROCEDURE TRAY) ×2 IMPLANT
KIT ROOM TURNOVER OR (KITS) ×2 IMPLANT
NDL HYPO 25GX1X1/2 BEV (NEEDLE) ×1 IMPLANT
NEEDLE HYPO 25GX1X1/2 BEV (NEEDLE) ×2 IMPLANT
NS IRRIG 1000ML POUR BTL (IV SOLUTION) ×2 IMPLANT
PACK SURGICAL SETUP 50X90 (CUSTOM PROCEDURE TRAY) ×2 IMPLANT
PAD ARMBOARD 7.5X6 YLW CONV (MISCELLANEOUS) ×2 IMPLANT
PENCIL BUTTON HOLSTER BLD 10FT (ELECTRODE) ×2 IMPLANT
SUT MNCRL AB 4-0 PS2 18 (SUTURE) ×2 IMPLANT
SUT VIC AB 3-0 54X BRD REEL (SUTURE) IMPLANT
SUT VIC AB 3-0 BRD 54 (SUTURE) ×2
SUT VIC AB 3-0 SH 27 (SUTURE) ×2
SUT VIC AB 3-0 SH 27X BRD (SUTURE) ×1 IMPLANT
SYR BULB 3OZ (MISCELLANEOUS) ×2 IMPLANT
SYR CONTROL 10ML LL (SYRINGE) ×2 IMPLANT
TOWEL OR 17X24 6PK STRL BLUE (TOWEL DISPOSABLE) ×2 IMPLANT
TOWEL OR 17X26 10 PK STRL BLUE (TOWEL DISPOSABLE) ×2 IMPLANT
TUBE CONNECTING 12X1/4 (SUCTIONS) IMPLANT
YANKAUER SUCT BULB TIP NO VENT (SUCTIONS) IMPLANT

## 2011-06-20 NOTE — Preoperative (Signed)
Beta Blockers   Reason not to administer Beta Blockers:Not Applicable 

## 2011-06-20 NOTE — Discharge Instructions (Signed)
Swollen Lymph Nodes The lymphatic system filters fluid from around cells. It is like a system of blood vessels. These channels carry lymph instead of blood. The lymphatic system is an important part of the immune (disease fighting) system. When people talk about "swollen glands in the neck," they are usually talking about swollen lymph nodes. The lymph nodes are like the little traps for infection. You and your caregiver may be able to feel lymph nodes, especially swollen nodes, in these common areas: the groin (inguinal area), armpits (axilla), and above the clavicle (supraclavicular). You may also feel them in the neck (cervical) and the back of the head just above the hairline (occipital). Swollen glands occur when there is any condition in which the body responds with an allergic type of reaction. For instance, the glands in the neck can become swollen from insect bites or any type of minor infection on the head. These are very noticeable in children with only minor problems. Lymph nodes may also become swollen when there is a tumor or problem with the lymphatic system, such as Hodgkin's disease. TREATMENT   Most swollen glands do not require treatment. They can be observed (watched) for a short period of time, if your caregiver feels it is necessary. Most of the time, observation is not necessary.   Antibiotics (medicines that kill germs) may be prescribed by your caregiver. Your caregiver may prescribe these if he or she feels the swollen glands are due to a bacterial (germ) infection. Antibiotics are not used if the swollen glands are caused by a virus.  HOME CARE INSTRUCTIONS   Take medications as directed by your caregiver. Only take over-the-counter or prescription medicines for pain, discomfort, or fever as directed by your caregiver.  SEEK MEDICAL CARE IF:   If you begin to run a temperature greater than 102 F (38.9 C), or as your caregiver suggests.  MAKE SURE YOU:   Understand these  instructions.   Will watch your condition.   Will get help right away if you are not doing well or get worse.  Document Released: 04/05/2002 Document Revised: 12/26/2010 Document Reviewed: 04/15/2005 Exit[employer] Patient Information 2012 [employer]. 

## 2011-06-20 NOTE — Anesthesia Preprocedure Evaluation (Addendum)
Anesthesia Evaluation  Patient identified by MRN, date of birth, ID band Patient awake    Reviewed: Allergy & Precautions, H&P , NPO status , Patient's Chart, lab work & pertinent test results  Airway  TM Distance: >3 FB Neck ROM: full    Dental   Pulmonary          Cardiovascular hypertension, regular Normal    Neuro/Psych PSYCHIATRIC DISORDERS    GI/Hepatic   Endo/Other  Diabetes mellitus-  Renal/GU      Musculoskeletal   Abdominal   Peds  Hematology   Anesthesia Other Findings   Reproductive/Obstetrics                           Anesthesia Physical Anesthesia Plan  ASA: II  Anesthesia Plan: General LMA   Post-op Pain Management:    Induction: Intravenous  Airway Management Planned: LMA  Additional Equipment:   Intra-op Plan:   Post-operative Plan: Extubation in OR  Informed Consent: I have reviewed the patients History and Physical, chart, labs and discussed the procedure including the risks, benefits and alternatives for the proposed anesthesia with the patient or authorized representative who has indicated his/her understanding and acceptance.   Dental advisory given  Plan Discussed with: Anesthesiologist, CRNA and Surgeon  Anesthesia Plan Comments:        Anesthesia Quick Evaluation

## 2011-06-20 NOTE — Op Note (Signed)
06/20/2011  9:58 AM  PATIENT:  Kristen Wheeler  71 y.o. female  PRE-OPERATIVE DIAGNOSIS:  enlarged lymph node  POST-OPERATIVE DIAGNOSIS:  enlarged lymph node  PROCEDURE:  Procedure(s) (LRB): AXILLARY LYMPH NODE BIOPSY (Left)  SURGEON:  Surgeon(s) and Role:    * Robyne Askew, MD - Primary  PHYSICIAN ASSISTANT:   ASSISTANTS: none   ANESTHESIA:   general  EBL:  Total I/O In: 1000 [I.V.:1000] Out: -   BLOOD ADMINISTERED:none  DRAINS: none   LOCAL MEDICATIONS USED:  MARCAINE     SPECIMEN:  Source of Specimen:  left axilla lipoma and lymph node  DISPOSITION OF SPECIMEN:  PATHOLOGY  COUNTS:  YES  TOURNIQUET:  * No tourniquets in log *  DICTATION: .Dragon Dictation After informed consent was obtained the patient was brought to the operating room and placed in the supine position on the operating room table. After adequate induction of general anesthesia the patient's left chest and axillary area were prepped with ChloraPrep, allowed to dry, and draped in usual sterile manner. There was a palpable mass in the left axilla. A small transverse incision was made with a 15 blade knife overlying the palpable mass. This incision was carried down through the skin and subcutaneous tissue sharply with the electrocautery. The palpable mass appeared to be a lipoma. It was excised sharply with the electrocautery and sent to pathology for further evaluation. After further palpation of the axilla there was an enlarged lymph node deeper that we could feel. This was identified by blunt hemostat dissection. We then removed the lymph node by combination of sharp Bovie dissection and then the lymphatics were clamped with hemostats divided and ligated with 3-0 Vicryl ties. This lymph node was sent to pathology for a lymphoma protocol. Hemostasis was achieved using the Bovie electrocautery. The wound was irrigated with copious amounts of saline. The wound was infiltrated with half percent Marcaine with  epinephrine and exparel. The deep layer the wound was then closed with interrupted 3-0 Vicryl stitches. The skin was closed with interrupted 4-0 Monocryl subcuticular stitches. Dermabond dressing was applied. The patient tolerated the procedure well. At the end of the case all needle sponge and instrument counts were correct. The patient was then awakened and taken to recovery in stable condition.  PLAN OF CARE: Discharge to home after PACU  PATIENT DISPOSITION:  PACU - hemodynamically stable.   Delay start of Pharmacological VTE agent (>24hrs) due to surgical blood loss or risk of bleeding: not applicable

## 2011-06-20 NOTE — H&P (Signed)
Patient Information       Patient Name Sex DOB SSN    Kristen Wheeler, Kristen Wheeler Female 1940/10/17 [id number]       Consult Note signed by Robyne Askew, MD at 04/19/11 1704     Author: Robyne Askew, MD Service: (none) Author Type: Physician    Filed: 04/19/11 1704 Note Time: 04/19/11 1700           Reason for Consult:failure to thrive Referring Physician: Dr. Rufina Falco Kristen Wheeler is an 71 y.o. female.   HPI: the patient is a 71 year old white female who has been in and out of the hospital with failure to thrive. She has a history of right breast DCIS. She recently had a PET scan that showed some enlarged axillary and cervical lymphadenopathy that was suggestive of metastatic disease. She states that she has no real appetite. She feels weak. She states that she has been losing weight.    Past Medical History   Diagnosis  Date   .  Dementia     .  Thyroid disease     .  Diabetes mellitus     .  Hypertension     .  Hyperlipidemia     .  Breast cancer          History reviewed. No pertinent past surgical history.    Family History   Problem  Relation  Age of Onset   .  Diabetes  Mother     .  Coronary artery disease  Mother     .  Coronary artery disease  Father          Social History:  reports that she has never smoked. She does not have any smokeless tobacco history on file. She reports that she does not drink alcohol or use illicit drugs.   Allergies:  Allergies   Allergen  Reactions   .  Penicillins         Advised was allergic to penicillin early in life.        Medications: I have reviewed the patient's current medications.    Results for orders placed during the hospital encounter of 04/16/11 (from the past 48 hour(s))   CARDIAC PANEL(CRET KIN+CKTOT+MB+TROPI)     Status: Normal     Collection Time     04/17/11  5:08 PM       Component  Value  Range  Comment     Total CK  67   7 - 177 (U/L)       CK, MB  1.7   0.3 - 4.0 (ng/mL)      Troponin I  <0.30   <0.30 (ng/mL)       Relative Index  RELATIVE INDEX IS INVALID   0.0 - 2.5      CANCER ANTIGEN 27.29     Status: Normal     Collection Time     04/18/11  7:00 AM       Component  Value  Range  Comment     CA 27.29  12   0 - 39 (U/mL)     PREALBUMIN     Status: Abnormal     Collection Time     04/18/11  7:00 AM       Component  Value  Range  Comment     Prealbumin  3.0 (*)  17.0 - 34.0 (mg/dL)     PATHOLOGIST SMEAR REVIEW     Status:  Normal     Collection Time     04/18/11  8:51 PM       Component  Value  Range  Comment     Tech Review                URINALYSIS, ROUTINE W REFLEX MICROSCOPIC     Status: Abnormal     Collection Time     04/19/11  2:57 AM       Component  Value  Range  Comment     Color, Urine  YELLOW   YELLOW        APPearance  CLEAR   CLEAR        Specific Gravity, Urine  1.009   1.005 - 1.030        pH  7.0   5.0 - 8.0        Glucose, UA  NEGATIVE   NEGATIVE (mg/dL)       Hgb urine dipstick  NEGATIVE   NEGATIVE        Bilirubin Urine  NEGATIVE   NEGATIVE        Ketones, ur  NEGATIVE   NEGATIVE (mg/dL)       Protein, ur  NEGATIVE   NEGATIVE (mg/dL)       Urobilinogen, UA  2.0 (*)  0.0 - 1.0 (mg/dL)       Nitrite  NEGATIVE   NEGATIVE        Leukocytes, UA  NEGATIVE   NEGATIVE   MICROSCOPIC NOT DONE ON URINES WITH NEGATIVE PROTEIN, BLOOD, LEUKOCYTES, NITRITE, OR GLUCOSE <1000 mg/dL.   CBC     Status: Abnormal     Collection Time     04/19/11  5:05 AM       Component  Value  Range  Comment     WBC  5.8   4.0 - 10.5 (K/uL)       RBC  3.21 (*)  3.87 - 5.11 (MIL/uL)       Hemoglobin  10.1 (*)  12.0 - 15.0 (g/dL)       HCT  16.1 (*)  09.6 - 46.0 (%)       MCV  95.6   78.0 - 100.0 (fL)       MCH  31.5   26.0 - 34.0 (pg)       MCHC  32.9   30.0 - 36.0 (g/dL)       RDW  04.5   40.9 - 15.5 (%)       Platelets  113 (*)  150 - 400 (K/uL)  PLATELET COUNT CONFIRMED BY SMEAR   BASIC METABOLIC PANEL     Status: Abnormal     Collection Time     04/19/11   5:05 AM       Component  Value  Range  Comment     Sodium  138   135 - 145 (mEq/L)       Potassium  3.1 (*)  3.5 - 5.1 (mEq/L)       Chloride  106   96 - 112 (mEq/L)       CO2  27   19 - 32 (mEq/L)       Glucose, Bld  91   70 - 99 (mg/dL)       BUN  13   6 - 23 (mg/dL)       Creatinine, Ser  1.15 (*)  0.50 - 1.10 (mg/dL)       Calcium  9.6   8.4 - 10.5 (mg/dL)       GFR calc non Af Amer  47 (*)  >90 (mL/min)       GFR calc Af Amer  55 (*)  >90 (mL/min)          Nm Pet Image Initial (pi) Skull Base To Thigh   04/19/2011  *RADIOLOGY REPORT*  Clinical Data:  Initial treatment strategy for pulmonary nodule.  NUCLEAR MEDICINE PET CT INITIAL (PI) SKULL BASE TO THIGH  Technique:  14.6 mCi F-18 FDG was injected intravenously via the left forearm.  Full-ring PET imaging was performed from the skull base through the mid-thighs 55  minutes after injection.  CT data was obtained and used for attenuation correction and anatomic localization only.  (This was not acquired as a diagnostic CT examination.)  Fasting Blood Glucose:  96  Patient Weight:  158 pounds.  Comparison:  None  Findings: Fluid levels noted within the paranasal sinuses.  Bilateral hypermetabolic cervical lymph nodes identified.  Left level II lymph node measures 1.1 cm and has an SUV max equal to 7.0  Right level II B lymph node measures 1.1 cm and has an SUV max equal to 7.0.  Within the left axilla there is an enlarged node measuring 1.9 cm. The SUV max associated with this lymph node is equal to 1.9.  No hypermetabolic right axillary lymph nodes. Nodule within the left side of the thyroid bed exhibits increased FDG uptake within SUV max equal to 7.0.  No hypermetabolic mediastinal or hilar lymph nodes.  There is no pericardial or pleural effusion identified.  The 7 mm nodule within the right lower lobe is too small to characterize but does not exhibit any malignant range FDG uptake.  There is no hypermetabolic pulmonary parenchymal nodule  or mass identified.  There is no abnormal FDG uptake within the liver parenchyma.  No abnormal liver uptake identified.  The spleen is borderline enlarged measuring 12 cm in craniocaudal dimension.  Both adrenal glands appear normal.  There is no abnormal pancreatic abnormality.  No hypermetabolic upper abdominal lymph nodes identified.  There is a small amount of ascites identified within the abdomen or pelvis.  No hypermetabolic pelvic or inguinal adenopathy.  IMPRESSION:  1.  Bilateral cervical lymph nodes exhibit malignant range FDG uptake.  Cannot rule out metastatic adenopathy. 2.  Hypermetabolic nodule within the left lobe of the thyroid gland.  Recommend further evaluation with thyroid sonography.  3.  Enlarged left axillary lymph node exhibits low level, borderline FDG uptake. 4.  Splenomegaly. 5.  Right lower lobe pulmonary nodule is too small reliably characterize by PET-CT.  Original Report Authenticated By: Rosealee Albee, M.D.       Review of Systems  Constitutional: Positive for weight loss and malaise/fatigue.  HENT: Negative.   Eyes: Negative.   Respiratory: Negative.   Cardiovascular: Negative.   Gastrointestinal: Negative.   Genitourinary: Negative.   Musculoskeletal: Negative.   Skin: Negative.   Neurological: Negative.   Endo/Heme/Allergies: Negative.   Psychiatric/Behavioral: Positive for memory loss.    Blood pressure 137/68, pulse 97, temperature 99.4 F (37.4 C), temperature source Oral, resp. rate 20, height 5\' 2"  (1.575 m), SpO2 94.00%. Physical Exam  Constitutional: She is oriented to person, place, and time. She appears well-developed and well-nourished.  HENT:   Head: Normocephalic and atraumatic.  Eyes: Conjunctivae and EOM are normal. Pupils are equal, round, and reactive to light.  Neck: Normal range of motion. Neck supple.  Cardiovascular: Normal rate, regular rhythm and normal heart sounds.   Respiratory: Effort normal and breath sounds normal.        Palpable left axillary lymph node  GI: Soft. Bowel sounds are normal. There is no tenderness.  Musculoskeletal: Normal range of motion.  Lymphadenopathy:    She has cervical adenopathy.  Neurological: She is alert and oriented to person, place, and time.  Skin: Skin is warm and dry.  Psychiatric:       Flat affect      Assessment/Plan: 71 year old white female with failure to thrive and a suspicious left axillary lymph node.I think initially a core biopsy of the left axillary lymph node would be reasonable. If this is metastatic breast cancer then that would be adequate to diagnose. If not then we could potentially do an open surgical biopsy of the lymph node.   TOTH III,Boone Gear S 04/19/2011, 5:00 PM

## 2011-06-20 NOTE — Anesthesia Postprocedure Evaluation (Signed)
  Anesthesia Post-op Note  Patient: Kristen Wheeler  Procedure(s) Performed: Procedure(s) (LRB): AXILLARY LYMPH NODE BIOPSY (Left)  Patient Location: PACU  Anesthesia Type: General  Level of Consciousness: awake, alert  and patient cooperative  Airway and Oxygen Therapy: Patient Spontanous Breathing and Patient connected to nasal cannula oxygen  Post-op Pain: none  Post-op Assessment: Post-op Vital signs reviewed, Patient's Cardiovascular Status Stable, Respiratory Function Stable, Patent Airway, No signs of Nausea or vomiting and Pain level controlled  Post-op Vital Signs: stable  Complications: No apparent anesthesia complications

## 2011-06-20 NOTE — Interval H&P Note (Signed)
History and Physical Interval Note:  06/20/2011 8:31 AM  Kristen Wheeler  has presented today for surgery, with the diagnosis of enlarged lymph node  The various methods of treatment have been discussed with the patient and family. After consideration of risks, benefits and other options for treatment, the patient has consented to  Procedure(s) (LRB): AXILLARY LYMPH NODE BIOPSY (Left) as a surgical intervention .  The patients' history has been reviewed, patient examined, no change in status, stable for surgery.  I have reviewed the patients' chart and labs.  Questions were answered to the patient's satisfaction.     TOTH III,Tuyet Bader S

## 2011-06-20 NOTE — Transfer of Care (Signed)
Immediate Anesthesia Transfer of Care Note  Patient: Kristen Wheeler  Procedure(s) Performed: Procedure(s) (LRB): AXILLARY LYMPH NODE BIOPSY (Left)  Patient Location: PACU  Anesthesia Type: General  Level of Consciousness: awake, alert , oriented and sedated  Airway & Oxygen Therapy: Patient Spontanous Breathing and Patient connected to nasal cannula oxygen  Post-op Assessment: Report given to PACU RN, Post -op Vital signs reviewed and stable and Patient moving all extremities  Post vital signs: Reviewed and stable  Complications: No apparent anesthesia complications

## 2011-06-21 MED FILL — Mupirocin Oint 2%: CUTANEOUS | Qty: 22 | Status: AC

## 2011-06-24 ENCOUNTER — Telehealth (INDEPENDENT_AMBULATORY_CARE_PROVIDER_SITE_OTHER): Payer: Self-pay | Admitting: General Surgery

## 2011-06-24 NOTE — Telephone Encounter (Signed)
I RECEIVED A VOICE MESSAGE FROM JACQUELINE/ DAUGHTER-IN LAW OF PT Kristen Wheeler RE PATHOLOGY REPORT STATUS/ I CALLED HER TO SAY THAT PATHOLOGY REPORT WAS NOT YET AVAILABLE AND THAT I WOULD CHECK AGAIN TOMORROW AND CALL WHEN  DR. TOTH HAS REVIEWED RESULTS/GY

## 2011-07-01 ENCOUNTER — Encounter (INDEPENDENT_AMBULATORY_CARE_PROVIDER_SITE_OTHER): Payer: Self-pay | Admitting: General Surgery

## 2011-07-02 ENCOUNTER — Encounter (INDEPENDENT_AMBULATORY_CARE_PROVIDER_SITE_OTHER): Payer: Medicare Other | Admitting: General Surgery

## 2011-07-15 ENCOUNTER — Encounter (INDEPENDENT_AMBULATORY_CARE_PROVIDER_SITE_OTHER): Payer: Medicare Other | Admitting: General Surgery

## 2011-07-25 ENCOUNTER — Encounter (INDEPENDENT_AMBULATORY_CARE_PROVIDER_SITE_OTHER): Payer: Medicare Other | Admitting: General Surgery

## 2012-07-14 ENCOUNTER — Telehealth: Payer: Self-pay | Admitting: Family Medicine

## 2012-07-14 NOTE — Telephone Encounter (Signed)
Results not back yet

## 2012-07-14 NOTE — Telephone Encounter (Signed)
WANTS RESULTS OF TESTS DONE LAST WEEK

## 2012-07-15 ENCOUNTER — Telehealth: Payer: Self-pay

## 2012-07-15 NOTE — Telephone Encounter (Signed)
Daughter in law notified.

## 2012-07-15 NOTE — Telephone Encounter (Signed)
Daughter in law notified results given

## 2012-07-16 MED ORDER — MEGESTROL ACETATE 625 MG/5ML PO SUSP: 625.0000 mg | Freq: Every day | ORAL | Status: DC

## 2012-07-16 NOTE — Addendum Note (Signed)
Addended by: Alfred Levins D on: 07/16/2012 04:47 PM   Modules accepted: Orders

## 2012-07-23 ENCOUNTER — Other Ambulatory Visit: Payer: Self-pay | Admitting: Family Medicine

## 2012-08-22 ENCOUNTER — Encounter (HOSPITAL_COMMUNITY): Payer: Self-pay | Admitting: *Deleted

## 2012-08-22 ENCOUNTER — Emergency Department (HOSPITAL_COMMUNITY): Payer: Medicare Other

## 2012-08-22 ENCOUNTER — Other Ambulatory Visit: Payer: Self-pay

## 2012-08-22 ENCOUNTER — Inpatient Hospital Stay (HOSPITAL_COMMUNITY)
Admission: EM | Admit: 2012-08-22 | Discharge: 2012-08-25 | DRG: 281 | Disposition: A | Discharge status: Home Health | Payer: Medicare Other | Attending: Internal Medicine | Admitting: Internal Medicine

## 2012-08-22 DIAGNOSIS — Z79899 Other long term (current) drug therapy: Secondary | ICD-10-CM

## 2012-08-22 DIAGNOSIS — D61818 Other pancytopenia: Secondary | ICD-10-CM

## 2012-08-22 DIAGNOSIS — A498 Other bacterial infections of unspecified site: Secondary | ICD-10-CM | POA: Diagnosis present

## 2012-08-22 DIAGNOSIS — Z880 Allergy status to penicillin: Secondary | ICD-10-CM

## 2012-08-22 DIAGNOSIS — G309 Alzheimer's disease, unspecified: Secondary | ICD-10-CM | POA: Diagnosis present

## 2012-08-22 DIAGNOSIS — I498 Other specified cardiac arrhythmias: Secondary | ICD-10-CM | POA: Diagnosis present

## 2012-08-22 DIAGNOSIS — I10 Essential (primary) hypertension: Secondary | ICD-10-CM

## 2012-08-22 DIAGNOSIS — E039 Hypothyroidism, unspecified: Secondary | ICD-10-CM | POA: Diagnosis present

## 2012-08-22 DIAGNOSIS — I441 Atrioventricular block, second degree: Secondary | ICD-10-CM

## 2012-08-22 DIAGNOSIS — E876 Hypokalemia: Secondary | ICD-10-CM | POA: Diagnosis present

## 2012-08-22 DIAGNOSIS — Z7982 Long term (current) use of aspirin: Secondary | ICD-10-CM

## 2012-08-22 DIAGNOSIS — R7989 Other specified abnormal findings of blood chemistry: Secondary | ICD-10-CM

## 2012-08-22 DIAGNOSIS — I129 Hypertensive chronic kidney disease with stage 1 through stage 4 chronic kidney disease, or unspecified chronic kidney disease: Secondary | ICD-10-CM | POA: Diagnosis present

## 2012-08-22 DIAGNOSIS — E785 Hyperlipidemia, unspecified: Secondary | ICD-10-CM

## 2012-08-22 DIAGNOSIS — D649 Anemia, unspecified: Secondary | ICD-10-CM | POA: Diagnosis present

## 2012-08-22 DIAGNOSIS — E860 Dehydration: Secondary | ICD-10-CM

## 2012-08-22 DIAGNOSIS — N179 Acute kidney failure, unspecified: Secondary | ICD-10-CM

## 2012-08-22 DIAGNOSIS — R55 Syncope and collapse: Principal | ICD-10-CM

## 2012-08-22 DIAGNOSIS — N183 Chronic kidney disease, stage 3 unspecified: Secondary | ICD-10-CM | POA: Diagnosis present

## 2012-08-22 DIAGNOSIS — F0390 Unspecified dementia without behavioral disturbance: Secondary | ICD-10-CM

## 2012-08-22 DIAGNOSIS — I447 Left bundle-branch block, unspecified: Secondary | ICD-10-CM | POA: Diagnosis present

## 2012-08-22 DIAGNOSIS — I214 Non-ST elevation (NSTEMI) myocardial infarction: Secondary | ICD-10-CM | POA: Diagnosis present

## 2012-08-22 DIAGNOSIS — E079 Disorder of thyroid, unspecified: Secondary | ICD-10-CM

## 2012-08-22 DIAGNOSIS — N390 Urinary tract infection, site not specified: Secondary | ICD-10-CM

## 2012-08-22 DIAGNOSIS — F0280 Dementia in other diseases classified elsewhere without behavioral disturbance: Secondary | ICD-10-CM | POA: Diagnosis present

## 2012-08-22 DIAGNOSIS — Z853 Personal history of malignant neoplasm of breast: Secondary | ICD-10-CM

## 2012-08-22 DIAGNOSIS — Z8249 Family history of ischemic heart disease and other diseases of the circulatory system: Secondary | ICD-10-CM

## 2012-08-22 DIAGNOSIS — Z833 Family history of diabetes mellitus: Secondary | ICD-10-CM

## 2012-08-22 LAB — CBC WITH DIFFERENTIAL/PLATELET
Basophils Absolute: 0 10*3/uL (ref 0.0–0.1)
Basophils Relative: 1 % (ref 0–1)
Eosinophils Absolute: 0 10*3/uL (ref 0.0–0.7)
Eosinophils Relative: 1 % (ref 0–5)
HCT: 26.2 % — ABNORMAL LOW (ref 36.0–46.0)
Hemoglobin: 9.2 g/dL — ABNORMAL LOW (ref 12.0–15.0)
Lymphocytes Relative: 28 % (ref 12–46)
Lymphs Abs: 0.7 10*3/uL (ref 0.7–4.0)
MCH: 32.6 pg (ref 26.0–34.0)
MCHC: 35.1 g/dL (ref 30.0–36.0)
MCV: 92.9 fL (ref 78.0–100.0)
Monocytes Absolute: 0.1 10*3/uL (ref 0.1–1.0)
Monocytes Relative: 6 % (ref 3–12)
Neutro Abs: 1.6 10*3/uL — ABNORMAL LOW (ref 1.7–7.7)
Neutrophils Relative %: 65 % (ref 43–77)
Platelets: 73 10*3/uL — ABNORMAL LOW (ref 150–400)
RBC: 2.82 MIL/uL — ABNORMAL LOW (ref 3.87–5.11)
RDW: 16.6 % — ABNORMAL HIGH (ref 11.5–15.5)
WBC: 2.5 10*3/uL — ABNORMAL LOW (ref 4.0–10.5)

## 2012-08-22 LAB — URINALYSIS, ROUTINE W REFLEX MICROSCOPIC
Bilirubin Urine: NEGATIVE
Glucose, UA: NEGATIVE mg/dL
Ketones, ur: NEGATIVE mg/dL
Nitrite: NEGATIVE
Protein, ur: 30 mg/dL — AB
Specific Gravity, Urine: 1.009 (ref 1.005–1.030)
Urobilinogen, UA: 0.2 mg/dL (ref 0.0–1.0)
pH: 7.5 (ref 5.0–8.0)

## 2012-08-22 LAB — URINE MICROSCOPIC-ADD ON

## 2012-08-22 LAB — BASIC METABOLIC PANEL
BUN: 12 mg/dL (ref 6–23)
CO2: 26 mEq/L (ref 19–32)
Calcium: 10.3 mg/dL (ref 8.4–10.5)
Chloride: 106 mEq/L (ref 96–112)
Creatinine, Ser: 1.51 mg/dL — ABNORMAL HIGH (ref 0.50–1.10)
GFR calc Af Amer: 39 mL/min — ABNORMAL LOW (ref >90)
GFR calc non Af Amer: 34 mL/min — ABNORMAL LOW (ref >90)
Glucose, Bld: 93 mg/dL (ref 70–99)
Potassium: 3.1 mEq/L — ABNORMAL LOW (ref 3.5–5.1)
Sodium: 140 mEq/L (ref 135–145)

## 2012-08-22 LAB — TROPONIN I: Troponin I: 1.08 ng/mL (ref <0.30)

## 2012-08-22 MED ORDER — MEGESTROL ACETATE 400 MG/10ML PO SUSP
625.0000 mg | Freq: Every day | ORAL | Status: DC
  Administered 2012-08-23: 625 mg via ORAL
  Filled 2012-08-22 (×2): qty 20

## 2012-08-22 MED ORDER — MEGESTROL ACETATE 625 MG/5ML PO SUSP: 625.0000 mg | Freq: Every day | ORAL | Status: DC

## 2012-08-22 MED ORDER — ACETAMINOPHEN 325 MG PO TABS
650.0000 mg | ORAL_TABLET | Freq: Four times a day (QID) | ORAL | Status: DC | PRN
  Filled 2012-08-22: qty 2

## 2012-08-22 MED ORDER — ENSURE COMPLETE PO LIQD
237.0000 mL | Freq: Three times a day (TID) | ORAL | Status: DC
  Administered 2012-08-23 – 2012-08-25 (×5): 237 mL via ORAL

## 2012-08-22 MED ORDER — ASPIRIN EC 325 MG PO TBEC
325.0000 mg | DELAYED_RELEASE_TABLET | Freq: Every day | ORAL | Status: DC
  Administered 2012-08-22 – 2012-08-25 (×4): 325 mg via ORAL
  Filled 2012-08-22 (×4): qty 1

## 2012-08-22 MED ORDER — ENOXAPARIN SODIUM 40 MG/0.4ML ~~LOC~~ SOLN: 40.0000 mg | SUBCUTANEOUS | Status: DC

## 2012-08-22 MED ORDER — ATORVASTATIN CALCIUM 20 MG PO TABS
20.0000 mg | ORAL_TABLET | Freq: Every day | ORAL | Status: DC
  Administered 2012-08-22 – 2012-08-24 (×2): 20 mg via ORAL
  Filled 2012-08-22 (×4): qty 1

## 2012-08-22 MED ORDER — CARVEDILOL 6.25 MG PO TABS
6.2500 mg | ORAL_TABLET | Freq: Two times a day (BID) | ORAL | Status: DC
  Administered 2012-08-23: 6.25 mg via ORAL
  Filled 2012-08-22 (×3): qty 1

## 2012-08-22 MED ORDER — ACETAMINOPHEN 650 MG RE SUPP: 650.0000 mg | Freq: Four times a day (QID) | RECTAL | Status: DC | PRN

## 2012-08-22 MED ORDER — FENOFIBRATE 160 MG PO TABS
160.0000 mg | ORAL_TABLET | Freq: Every day | ORAL | Status: DC
  Administered 2012-08-23 – 2012-08-25 (×3): 160 mg via ORAL
  Filled 2012-08-22 (×3): qty 1

## 2012-08-22 MED ORDER — SODIUM CHLORIDE 0.9 % IJ SOLN
3.0000 mL | Freq: Two times a day (BID) | INTRAMUSCULAR | Status: DC
  Administered 2012-08-22 – 2012-08-25 (×4): 3 mL via INTRAVENOUS

## 2012-08-22 MED ORDER — LEVOTHYROXINE SODIUM 75 MCG PO TABS
75.0000 ug | ORAL_TABLET | Freq: Every day | ORAL | Status: DC
  Administered 2012-08-23 – 2012-08-25 (×3): 75 ug via ORAL
  Filled 2012-08-22 (×5): qty 1

## 2012-08-22 MED ORDER — SODIUM CHLORIDE 0.9 % IV SOLN
INTRAVENOUS | Status: DC
  Administered 2012-08-22: 23:00:00 via INTRAVENOUS
  Administered 2012-08-23: 1000 mL via INTRAVENOUS
  Administered 2012-08-24 – 2012-08-25 (×2): via INTRAVENOUS

## 2012-08-22 MED ORDER — AMLODIPINE BESYLATE 10 MG PO TABS
10.0000 mg | ORAL_TABLET | Freq: Every day | ORAL | Status: DC
  Administered 2012-08-22 – 2012-08-25 (×4): 10 mg via ORAL
  Filled 2012-08-22 (×4): qty 1

## 2012-08-22 MED ORDER — DONEPEZIL HCL 10 MG PO TABS
10.0000 mg | ORAL_TABLET | Freq: Every day | ORAL | Status: DC
  Administered 2012-08-22: 10 mg via ORAL
  Filled 2012-08-22 (×3): qty 1

## 2012-08-22 MED ORDER — VITAMIN D3 25 MCG (1000 UNIT) PO TABS
1000.0000 [IU] | ORAL_TABLET | Freq: Every day | ORAL | Status: DC
  Administered 2012-08-23 – 2012-08-25 (×3): 1000 [IU] via ORAL
  Filled 2012-08-22 (×3): qty 1

## 2012-08-22 MED ORDER — POTASSIUM CHLORIDE CRYS ER 20 MEQ PO TBCR
40.0000 meq | EXTENDED_RELEASE_TABLET | Freq: Once | ORAL | Status: AC
  Administered 2012-08-22: 40 meq via ORAL
  Filled 2012-08-22: qty 2

## 2012-08-22 MED ORDER — ENSURE IMMUNE HEALTH PO LIQD: 237.0000 mL | Freq: Three times a day (TID) | ORAL | Status: DC

## 2012-08-22 NOTE — ED Notes (Signed)
Family at bedside. 

## 2012-08-22 NOTE — ED Notes (Addendum)
Pt came from home and got up from her bed and passed out; hit face on nightstand; lost consciousness. Has bruising to right eye and left lip bruising. Pt c/o Lives with family. Hx of breast cancer and dementia but can perform ADLs at baseline. Initially bradycardic when EMS got on scene (30-40's) then HR returned to normal and has stayed there since.

## 2012-08-22 NOTE — ED Notes (Signed)
Patient transported to CT then x-ray 

## 2012-08-22 NOTE — ED Provider Notes (Signed)
History     CSN: [id number]  Arrival date & time 08/22/12  1328   First MD Initiated Contact with Patient 08/22/12 1341      Chief Complaint  Patient presents with  . Fall    (Consider location/radiation/quality/duration/timing/severity/associated sxs/prior treatment) HPI Patient presents emergency department following a syncopal episode.  Patient has Alzheimer's dementia and is unable to give me an accurate history.  The son and the father provided the history.  The husband was getting the patient ready to go to a funeral and sit her up in bed.  He came back when he heard her fall and found her non responsive.  The patient denies any pain to me.  Patient's not had any fever, vomiting, diarrhea, chest pain, shortness of breath, lethargy, or decreased oral intake per the husband.  Past Medical History  Diagnosis Date  . Dementia   . Thyroid disease   . Hyperlipidemia   . Breast cancer   . Hypertension     primary Dr. Modesto Charon, saw last Oct 2012,    Past Surgical History  Procedure Laterality Date  . Appendectomy    . Breast surgery      Family History  Problem Relation Age of Onset  . Diabetes Mother   . Coronary artery disease Mother   . Coronary artery disease Father     History  Substance Use Topics  . Smoking status: Never Smoker   . Smokeless tobacco: Not on file  . Alcohol Use: No    OB History   Grav Para Term Preterm Abortions TAB SAB Ect Mult Living                  Review of Systems Level V caveat applies due to dementia Allergies  Penicillins  Home Medications   Current Outpatient Rx  Name  Route  Sig  Dispense  Refill  . amLODipine (NORVASC) 10 MG tablet   Oral   Take 10 mg by mouth daily.         Marland Kitchen aspirin 81 MG tablet   Oral   Take 81 mg by mouth at bedtime.         Marland Kitchen atorvastatin (LIPITOR) 20 MG tablet   Oral   Take 20 mg by mouth at bedtime.          . carvedilol (COREG) 6.25 MG tablet   Oral   Take 6.25 mg by mouth 2 (two)  times daily with a meal.         . Cholecalciferol (VITAMIN D PO)   Oral   Take 1 tablet by mouth daily.         Marland Kitchen donepezil (ARICEPT) 10 MG tablet   Oral   Take 10 mg by mouth at bedtime.          . fenofibrate 160 MG tablet   Oral   Take 160 mg by mouth daily.         Marland Kitchen levothyroxine (SYNTHROID, LEVOTHROID) 75 MCG tablet   Oral   Take 75 mcg by mouth daily before breakfast.         . lovastatin (MEVACOR) 40 MG tablet   Oral   Take 80 mg by mouth at bedtime.          . megestrol (MEGACE ES) 625 MG/5ML suspension   Oral   Take 5 mLs (625 mg total) by mouth daily.   150 mL   0   . feeding supplement (ENSURE IMMUNE HEALTH) LIQD  Oral   Take 237 mLs by mouth 3 (three) times daily with meals.   12 Bottle   1     BP 129/79  Pulse 63  Temp(Src) 97.1 F (36.2 C) (Oral)  Resp 15  SpO2 100%  Physical Exam  Constitutional: She appears well-developed and well-nourished. No distress.  HENT:  Head: Normocephalic and atraumatic.  Mouth/Throat: Oropharynx is clear and moist.  Eyes: Pupils are equal, round, and reactive to light.  Neck: Normal range of motion. Neck supple.  Cardiovascular: Normal rate, regular rhythm and normal heart sounds.  Exam reveals no gallop and no friction rub.   No murmur heard. Pulmonary/Chest: Effort normal and breath sounds normal. No respiratory distress.  Abdominal: Soft. Bowel sounds are normal. She exhibits no distension. There is no tenderness. There is no rebound.  Musculoskeletal: She exhibits edema.  Neurological: She is alert. She exhibits normal muscle tone. Coordination normal.  Skin: Skin is warm and dry.    ED Course  Procedures (including critical care time)  Labs Reviewed  CBC WITH DIFFERENTIAL - Abnormal; Notable for the following:    WBC 2.5 (*)    RBC 2.82 (*)    Hemoglobin 9.2 (*)    HCT 26.2 (*)    RDW 16.6 (*)    Platelets 73 (*)    Neutro Abs 1.6 (*)    All other components within normal limits   BASIC METABOLIC PANEL - Abnormal; Notable for the following:    Potassium 3.1 (*)    Creatinine, Ser 1.51 (*)    GFR calc non Af Amer 34 (*)    GFR calc Af Amer 39 (*)    All other components within normal limits  URINALYSIS, ROUTINE W REFLEX MICROSCOPIC - Abnormal; Notable for the following:    APPearance CLOUDY (*)    Hgb urine dipstick TRACE (*)    Protein, ur 30 (*)    Leukocytes, UA MODERATE (*)    All other components within normal limits  TROPONIN I - Abnormal; Notable for the following:    Troponin I 1.08 (*)    All other components within normal limits  URINE MICROSCOPIC-ADD ON - Abnormal; Notable for the following:    Squamous Epithelial / LPF FEW (*)    All other components within normal limits   Dg Chest 1 View  08/22/2012  *RADIOLOGY REPORT*  Clinical Data: History of trauma from a fall.  CHEST - 1 VIEW  Comparison: Chest x-ray 04/16/2011.  Findings: Lung volumes are slightly low.  No acute consolidative airspace disease.  No pleural effusions.  Pulmonary vasculature is within normal limits.  Heart size appears borderline enlarged (likely accentuated by low lung volume).  Upper mediastinal contours are within normal limits.  Atherosclerosis of the thoracic aorta.  No pneumothorax.  Visualized bony thorax is grossly intact. Numerous surgical clips project over the right breast, suggesting prior lumpectomy.  IMPRESSION: 1.  Low lung volumes without radiographic evidence of acute cardiopulmonary disease. 2.  Atherosclerosis.   Original Report Authenticated By: Trudie Reed, M.D.    Dg Pelvis 1-2 Views  08/22/2012  *RADIOLOGY REPORT*  Clinical Data: History of trauma from a fall.  PELVIS - 1-2 VIEW  Comparison: No priors.  Findings: AP view of the pelvis demonstrates no acute displaced fracture of the bony pelvic ring.  Bilateral proximal femurs as visualized appear intact.  Femoral heads appear to project over the acetabula bilaterally suggesting that they are properly located.  Joint space narrowing, subchondral sclerosis, subchondral cyst  formation and osteophyte formation is noted in the hip joints bilaterally (right greater than left), with osteoarthritis.  IMPRESSION: 1.  No acute radiographic abnormality of the bony pelvis. 2.  Mild bilateral hip joint osteoarthritis (right greater than left).   Original Report Authenticated By: Trudie Reed, M.D.    Ct Head Wo Contrast  08/22/2012  *RADIOLOGY REPORT*  Clinical Data:  72 year old female with fall, head and neck injury with loss of consciousness, headache and neck pain.  History of breast cancer.  CT HEAD WITHOUT CONTRAST CT CERVICAL SPINE WITHOUT CONTRAST  Technique:  Multidetector CT imaging of the head and cervical spine was performed following the standard protocol without intravenous contrast.  Multiplanar CT image reconstructions of the cervical spine were also generated.  Comparison:  04/11/2011 head CT  CT HEAD  Findings: Mild chronic small vessel white matter ischemic changes are identified. No acute intracranial abnormalities are identified, including mass lesion or mass effect, hydrocephalus, extra-axial fluid collection, midline shift, hemorrhage, or acute infarction.  The visualized bony calvarium is unremarkable. Right scalp soft tissue swelling is noted.  IMPRESSION: No evidence of acute intracranial abnormality.  Right scalp soft tissue swelling without fracture.  CT CERVICAL SPINE  Findings: Normal alignment is noted. There is no evidence of acute fracture, subluxation or prevertebral soft tissue swelling. Multilevel degenerative disc disease and spondylosis identified, greatest at C4-C5 and C5-C6.  Mild central spinal narrowing throughout the cervical spine is identified. A moderate central disc protrusion at C3-C4 contacts the cord. No other soft tissue abnormalities are identified. No focal bony lesions are present.  IMPRESSION: No static evidence of acute injury to the cervical spine.  Moderate central disc  protrusion at C3-C4 contacting the cord.  Multilevel degenerative changes.   Original Report Authenticated By: Harmon Pier, M.D.    Ct Cervical Spine Wo Contrast  08/22/2012  *RADIOLOGY REPORT*  Clinical Data:  72 year old female with fall, head and neck injury with loss of consciousness, headache and neck pain.  History of breast cancer.  CT HEAD WITHOUT CONTRAST CT CERVICAL SPINE WITHOUT CONTRAST  Technique:  Multidetector CT imaging of the head and cervical spine was performed following the standard protocol without intravenous contrast.  Multiplanar CT image reconstructions of the cervical spine were also generated.  Comparison:  04/11/2011 head CT  CT HEAD  Findings: Mild chronic small vessel white matter ischemic changes are identified. No acute intracranial abnormalities are identified, including mass lesion or mass effect, hydrocephalus, extra-axial fluid collection, midline shift, hemorrhage, or acute infarction.  The visualized bony calvarium is unremarkable. Right scalp soft tissue swelling is noted.  IMPRESSION: No evidence of acute intracranial abnormality.  Right scalp soft tissue swelling without fracture.  CT CERVICAL SPINE  Findings: Normal alignment is noted. There is no evidence of acute fracture, subluxation or prevertebral soft tissue swelling. Multilevel degenerative disc disease and spondylosis identified, greatest at C4-C5 and C5-C6.  Mild central spinal narrowing throughout the cervical spine is identified. A moderate central disc protrusion at C3-C4 contacts the cord. No other soft tissue abnormalities are identified. No focal bony lesions are present.  IMPRESSION: No static evidence of acute injury to the cervical spine.  Moderate central disc protrusion at C3-C4 contacting the cord.  Multilevel degenerative changes.   Original Report Authenticated By: Harmon Pier, M.D.      1. Syncope    I spoke with Dr. wall of cardiology.  He states, that he will follow along with the patient.   Patient will be admitted by  the [facility].  Patient is stable otherwise here in the emergency department    MDM  MDM Reviewed: vitals, nursing note and previous chart Interpretation: labs, ECG, CT scan and x-ray            Carlyle Dolly, PA-C 08/23/12 0825

## 2012-08-22 NOTE — ED Notes (Signed)
Notified by lab that pt's troponin is 1.08.  EDP made aware.

## 2012-08-22 NOTE — H&P (Addendum)
[facility] History and Physical  Kristen Wheeler RUE:[id number] DOB: 27-Nov-1940 DOA: 08/22/2012  Referring physician: ED PCP: Redmond Baseman, MD   Chief Complaint: syncope   History provided by patient's son at bedside as patient is quite demented  HPI:  72 year old female wit history of dementia, hypothyroidism, hyperlipidemia, hypertension and history of breast cancer who was getting ready to go to a funeral when she was found to be on the floor next to the bed. As per her son who found her lying on the floor she was sitting at the edge of the bed dressing up. She was unresponsive for almost 5-10 minutes until EMS arrived. Her son informs that her heart rate was in the 30s when EMS checked it. She then woke up and was not in any distress. No witnessed seizure for focal weakness noted. Recent denied any symptoms and does not recall the event. She did not have any chest pain, palpitations, shortness of breath, nausea, vomiting, complaining of abdominal pain, dizziness, bloody vision headache, bowel or urinary symptoms. Patient has a good appetite and family did not think she was dehydrated. At baseline she is usually home bound and minimally ambulates.    Course in the ED In the ED her vitals were stable. EKG done showed normal sinus rhythm with left bundle branch block which seemed unchanged from her previous EKGs. A head CT was unremarkable, x-ray of the hip done to rule out for factor was negative. Blood level done showed mild pancytopenia, normal chemistry but with an elevated troponin of 1.08.  [facility] cardiology was consulted by the ED physician and recommended to monitor clinically and cycle her enzymes. [facility].   Review of Systems:  As outlined in the history of present illness. Review of systems markedly Limited given significant dementia.  Past Medical History  Diagnosis Date  . Dementia   . Thyroid disease   . Hyperlipidemia   .  Breast cancer   . Hypertension     primary Dr. Modesto Charon, saw last Oct 2012,   Past Surgical History  Procedure Laterality Date  . Appendectomy    . Breast surgery     Social History:  reports that she has never smoked. She does not have any smokeless tobacco history on file. She reports that she does not drink alcohol or use illicit drugs.  Allergies  Allergen Reactions  . Penicillins     Advised was allergic to penicillin early in life.    Family History  Problem Relation Age of Onset  . Diabetes Mother   . Coronary artery disease Mother   . Coronary artery disease Father     Prior to Admission medications   Medication Sig Start Date End Date Taking? Authorizing Provider  amLODipine (NORVASC) 10 MG tablet Take 10 mg by mouth daily.   Yes Historical Provider, MD  aspirin 81 MG tablet Take 81 mg by mouth at bedtime.   Yes Historical Provider, MD  atorvastatin (LIPITOR) 20 MG tablet Take 20 mg by mouth at bedtime.    Yes Historical Provider, MD  carvedilol (COREG) 6.25 MG tablet Take 6.25 mg by mouth 2 (two) times daily with a meal.   Yes Historical Provider, MD  Cholecalciferol (VITAMIN D PO) Take 1 tablet by mouth daily.   Yes Historical Provider, MD  donepezil (ARICEPT) 10 MG tablet Take 10 mg by mouth at bedtime.    Yes Historical Provider, MD  fenofibrate 160 MG tablet Take 160 mg by mouth  daily.   Yes Historical Provider, MD  levothyroxine (SYNTHROID, LEVOTHROID) 75 MCG tablet Take 75 mcg by mouth daily before breakfast.   Yes Historical Provider, MD  lovastatin (MEVACOR) 40 MG tablet Take 80 mg by mouth at bedtime.    Yes Historical Provider, MD  megestrol (MEGACE ES) 625 MG/5ML suspension Take 5 mLs (625 mg total) by mouth daily. 07/16/12  Yes Ileana Ladd, MD  feeding supplement [facility] IMMUNE HEALTH) LIQD Take 237 mLs by mouth 3 (three) times daily with meals. 04/21/11   Alba Cory, MD    Physical Exam:  Filed Vitals:   08/22/12 1344 08/22/12 1750  BP: 129/79  188/79  Pulse: 63 70  Temp: 97.1 F (36.2 C) 98.4 F (36.9 C)  TempSrc: Oral Oral  Resp: 15 18  SpO2: 100% 99%    Constitutional: Vital signs reviewed.  Patient is a thin built female lying in bed in no acute distress Head: Has a small laceration above right eyebrow. Mouth: no erythema or exudates, MMM, small laceration below the left lip Eyes: PERRL, EOMI, conjunctivae normal, No scleral icterus.  Neck: Supple, Trachea midline normal ROM, No JVD, mass, thyromegaly, or carotid bruit present.  Cardiovascular: RRR, S1 normal, S2 normal, 2/6 systolic murmur, pulses symmetric and intact bilaterally Pulmonary/Chest: CTAB, no wheezes, rales, or rhonchi Abdominal: Soft. Non-tender, non-distended, bowel sounds are normal, no masses, organomegaly, or guarding present.  GU: no CVA tenderness Musculoskeletal: No joint deformities, erythema, or stiffness, ROM full and no nontender Ext: 1+ pitting edema bilaterally,  no cyanosis, pulses palpable bilaterally (DP and PT) Hematology: no cervical, inginal, or axillary adenopathy.  Neurological: A&O x2, Strenght is normal and symmetric bilaterally, cranial nerve II-XII are grossly intact, no focal motor deficit, sensory intact to light touch bilaterally.  Skin: Warm, dry and intact. No rash, cyanosis, or clubbing.  Psychiatric: Normal mood and affect. speech and behavior is normal. Poor judgment, cognition and memory Labs on Admission:  Basic Metabolic Panel:  Recent Labs Lab 08/22/12 1354  NA 140  K 3.1*  CL 106  CO2 26  GLUCOSE 93  BUN 12  CREATININE 1.51*  CALCIUM 10.3   Liver Function Tests: No results found for this basename: AST, ALT, ALKPHOS, BILITOT, PROT, ALBUMIN,  in the last 168 hours No results found for this basename: LIPASE, AMYLASE,  in the last 168 hours No results found for this basename: AMMONIA,  in the last 168 hours CBC:  Recent Labs Lab 08/22/12 1354  WBC 2.5*  NEUTROABS 1.6*  HGB 9.2*  HCT 26.2*  MCV 92.9   PLT 73*   Cardiac Enzymes:  Recent Labs Lab 08/22/12 1515  TROPONINI 1.08*   BNP: No components found with this basename: POCBNP,  CBG: No results found for this basename: GLUCAP,  in the last 168 hours  Radiological Exams on Admission: Dg Chest 1 View  08/22/2012  *RADIOLOGY REPORT*  Clinical Data: History of trauma from a fall.  CHEST - 1 VIEW  Comparison: Chest x-ray 04/16/2011.  Findings: Lung volumes are slightly low.  No acute consolidative airspace disease.  No pleural effusions.  Pulmonary vasculature is within normal limits.  Heart size appears borderline enlarged (likely accentuated by low lung volume).  Upper mediastinal contours are within normal limits.  Atherosclerosis of the thoracic aorta.  No pneumothorax.  Visualized bony thorax is grossly intact. Numerous surgical clips project over the right breast, suggesting prior lumpectomy.  IMPRESSION: 1.  Low lung volumes without radiographic evidence of acute cardiopulmonary  disease. 2.  Atherosclerosis.   Original Report Authenticated By: Trudie Reed, M.D.    Dg Pelvis 1-2 Views  08/22/2012  *RADIOLOGY REPORT*  Clinical Data: History of trauma from a fall.  PELVIS - 1-2 VIEW  Comparison: No priors.  Findings: AP view of the pelvis demonstrates no acute displaced fracture of the bony pelvic ring.  Bilateral proximal femurs as visualized appear intact.  Femoral heads appear to project over the acetabula bilaterally suggesting that they are properly located. Joint space narrowing, subchondral sclerosis, subchondral cyst formation and osteophyte formation is noted in the hip joints bilaterally (right greater than left), with osteoarthritis.  IMPRESSION: 1.  No acute radiographic abnormality of the bony pelvis. 2.  Mild bilateral hip joint osteoarthritis (right greater than left).   Original Report Authenticated By: Trudie Reed, M.D.    Ct Head Wo Contrast  08/22/2012  *RADIOLOGY REPORT*  Clinical Data:  72 year old female with  fall, head and neck injury with loss of consciousness, headache and neck pain.  History of breast cancer.  CT HEAD WITHOUT CONTRAST CT CERVICAL SPINE WITHOUT CONTRAST  Technique:  Multidetector CT imaging of the head and cervical spine was performed following the standard protocol without intravenous contrast.  Multiplanar CT image reconstructions of the cervical spine were also generated.  Comparison:  04/11/2011 head CT  CT HEAD  Findings: Mild chronic small vessel white matter ischemic changes are identified. No acute intracranial abnormalities are identified, including mass lesion or mass effect, hydrocephalus, extra-axial fluid collection, midline shift, hemorrhage, or acute infarction.  The visualized bony calvarium is unremarkable. Right scalp soft tissue swelling is noted.  IMPRESSION: No evidence of acute intracranial abnormality.  Right scalp soft tissue swelling without fracture.  CT CERVICAL SPINE  Findings: Normal alignment is noted. There is no evidence of acute fracture, subluxation or prevertebral soft tissue swelling. Multilevel degenerative disc disease and spondylosis identified, greatest at C4-C5 and C5-C6.  Mild central spinal narrowing throughout the cervical spine is identified. A moderate central disc protrusion at C3-C4 contacts the cord. No other soft tissue abnormalities are identified. No focal bony lesions are present.  IMPRESSION: No static evidence of acute injury to the cervical spine.  Moderate central disc protrusion at C3-C4 contacting the cord.  Multilevel degenerative changes.   Original Report Authenticated By: Harmon Pier, M.D.    Ct Cervical Spine Wo Contrast  08/22/2012  *RADIOLOGY REPORT*  Clinical Data:  72 year old female with fall, head and neck injury with loss of consciousness, headache and neck pain.  History of breast cancer.  CT HEAD WITHOUT CONTRAST CT CERVICAL SPINE WITHOUT CONTRAST  Technique:  Multidetector CT imaging of the head and cervical spine was  performed following the standard protocol without intravenous contrast.  Multiplanar CT image reconstructions of the cervical spine were also generated.  Comparison:  04/11/2011 head CT  CT HEAD  Findings: Mild chronic small vessel white matter ischemic changes are identified. No acute intracranial abnormalities are identified, including mass lesion or mass effect, hydrocephalus, extra-axial fluid collection, midline shift, hemorrhage, or acute infarction.  The visualized bony calvarium is unremarkable. Right scalp soft tissue swelling is noted.  IMPRESSION: No evidence of acute intracranial abnormality.  Right scalp soft tissue swelling without fracture.  CT CERVICAL SPINE  Findings: Normal alignment is noted. There is no evidence of acute fracture, subluxation or prevertebral soft tissue swelling. Multilevel degenerative disc disease and spondylosis identified, greatest at C4-C5 and C5-C6.  Mild central spinal narrowing throughout the cervical spine is identified. A  moderate central disc protrusion at C3-C4 contacts the cord. No other soft tissue abnormalities are identified. No focal bony lesions are present.  IMPRESSION: No static evidence of acute injury to the cervical spine.  Moderate central disc protrusion at C3-C4 contacting the cord.  Multilevel degenerative changes.   Original Report Authenticated By: Harmon Pier, M.D.     EKG: Sinus rhythm at 61 with a left bundle branch block which is old  Assessment/Plan Principal Problem:   Syncope Possibly neurocardiogenic versus seizure Admit to telemetry. Continue neuro check and seizure precaution. Check orthostatic vitals. -cycles cardiac enzymes. She denies any chest pain or shortness of breath. I would place her on full dose aspirin. Given the lack of EKG changes or chest pain symptoms and low platelets I will rub on anticoagulation. [facility]  cardiology was consulted from the ED. -Check 2-D echo, carotid Dopplers and MRI brain. -Will check EEG to  rule out seizure -  Elevated troponin Unclear if this is true ACS. Started on full dose ASA. Hold anticoagulation. Continue statin and beta blocker she is on at home. Follow serial cardiac enzymes and EKG. Monitor on telemetry [facility] cardiology was consulted from the ED. We'll follow up with recommendation. Of note, she had a nuclear stress test done in December 2012 which showed possible mild ischemia.    Hypertension Blood pressure stable. Continue home medications    Thyroid disease On Synthroid. Check TSH    Dementia Has significant short-term memory loss as per son. Hold Aricept as it can cause bradycardia.      Acute kidney injury No descent baseline available in the system. I would place her on gentle hydration and monitor. UA unremarkable Replenish ow potassium    Other pancytopenia CBC from one year back showed mild anemia otherwise unremarkable. We'll monitor closely   Diet: Cardiac  DVT prophylaxis: Given low platelets I would place her on SCDs.  Code Status: Full code Family Communication: Husband and son at bedside Disposition Plan: Admit to telemetry  Eddie [facility] Pager [phone]  If 7PM-7AM, please contact night-coverage [email or web address] Password [email or web address] 08/22/2012, 5:57 PM  TOTAL TIME SPENT: 70 MINUTES

## 2012-08-23 ENCOUNTER — Inpatient Hospital Stay (HOSPITAL_COMMUNITY): Payer: Medicare Other

## 2012-08-23 DIAGNOSIS — I441 Atrioventricular block, second degree: Secondary | ICD-10-CM

## 2012-08-23 DIAGNOSIS — I517 Cardiomegaly: Secondary | ICD-10-CM

## 2012-08-23 DIAGNOSIS — E079 Disorder of thyroid, unspecified: Secondary | ICD-10-CM

## 2012-08-23 DIAGNOSIS — N390 Urinary tract infection, site not specified: Secondary | ICD-10-CM

## 2012-08-23 DIAGNOSIS — R55 Syncope and collapse: Secondary | ICD-10-CM

## 2012-08-23 DIAGNOSIS — F0390 Unspecified dementia without behavioral disturbance: Secondary | ICD-10-CM

## 2012-08-23 LAB — BASIC METABOLIC PANEL
BUN: 11 mg/dL (ref 6–23)
Chloride: 108 mEq/L (ref 96–112)
Glucose, Bld: 81 mg/dL (ref 70–99)
Potassium: 3.1 mEq/L — ABNORMAL LOW (ref 3.5–5.1)

## 2012-08-23 LAB — CBC
HCT: 23.7 % — ABNORMAL LOW (ref 36.0–46.0)
Hemoglobin: 8.1 g/dL — ABNORMAL LOW (ref 12.0–15.0)
MCH: 32.3 pg (ref 26.0–34.0)
MCHC: 34.2 g/dL (ref 30.0–36.0)

## 2012-08-23 LAB — TROPONIN I: Troponin I: 0.74 ng/mL (ref <0.30)

## 2012-08-23 LAB — LIPID PANEL
Cholesterol: 80 mg/dL (ref 0–200)
HDL: 15 mg/dL — ABNORMAL LOW (ref >39)
Total CHOL/HDL Ratio: 5.3 RATIO

## 2012-08-23 LAB — TSH: TSH: 1.22 u[IU]/mL (ref 0.350–4.500)

## 2012-08-23 MED ORDER — LORAZEPAM 2 MG/ML IJ SOLN
INTRAMUSCULAR | Status: AC
  Administered 2012-08-23: 0.5 mg via INTRAVENOUS
  Filled 2012-08-23: qty 1

## 2012-08-23 MED ORDER — LORAZEPAM 2 MG/ML IJ SOLN: 0.5000 mg | Freq: Four times a day (QID) | INTRAMUSCULAR | Status: DC | PRN

## 2012-08-23 MED ORDER — CIPROFLOXACIN IN D5W 400 MG/200ML IV SOLN
400.0000 mg | Freq: Two times a day (BID) | INTRAVENOUS | Status: DC
  Administered 2012-08-23 – 2012-08-25 (×4): 400 mg via INTRAVENOUS
  Filled 2012-08-23 (×5): qty 200

## 2012-08-23 MED ORDER — POTASSIUM CHLORIDE CRYS ER 20 MEQ PO TBCR
40.0000 meq | EXTENDED_RELEASE_TABLET | ORAL | Status: AC
  Administered 2012-08-23: 40 meq via ORAL
  Filled 2012-08-23: qty 2

## 2012-08-23 MED ORDER — MEGESTROL ACETATE 400 MG/10ML PO SUSP
800.0000 mg | Freq: Every day | ORAL | Status: DC
  Administered 2012-08-24 – 2012-08-25 (×2): 800 mg via ORAL
  Filled 2012-08-23 (×2): qty 20

## 2012-08-23 NOTE — Progress Notes (Signed)
Please call son Michaeleen Down or her daughter in Audiological scientist for information . They have [employer]  Pre[employer]. [phone]. Thanks Sakura Denis, Terrilyn Saver RN

## 2012-08-23 NOTE — Progress Notes (Signed)
VASCULAR LAB PRELIMINARY  PRELIMINARY  PRELIMINARY  PRELIMINARY  Carotid Dopplers completed.    Preliminary report:  There is no ICA stenosis.  Vertebral artery flow is antegrade.  Duard Spiewak, RVT 08/23/2012, 10:54 AM

## 2012-08-23 NOTE — Progress Notes (Signed)
  Echocardiogram 2D Echocardiogram has been performed.  Georgian Co 08/23/2012, 10:49 AM

## 2012-08-23 NOTE — ED Provider Notes (Signed)
Medical screening examination/treatment/procedure(s) were performed by non-physician practitioner and as supervising physician I was immediately available for consultation/collaboration.  Keyonia Gluth R. Ibrahem Volkman, MD 08/23/12 1537 

## 2012-08-23 NOTE — Consult Note (Signed)
Cardiology Consult Note   Patient ID: Kristen Wheeler MRN: [id number], DOB/AGE: [birth year]   Admit date: 08/22/2012 Date of Consult: 08/23/2012  Primary Physician: Redmond Baseman, MD Primary Cardiologist: Lovina Reach, MD  Reason for consult: syncope, + troponin  HPI: Kristen Wheeler is a 72 y.o. female with a PMHx s/f mod-severe dementia, history of LBBB, abnormal Myoview (see below), HLD, HTN, hypothyroidism and h/o breast CA who was admitted yesterday for syncope.   Lexiscan Myoview 04/2011: Abnormal stress nuclear study with a small predominantly fixed distal anterior and apical defect consistent with infarct (vs changes related to LBBB); very mild reversibility in the distal anterior wall; cannot exclude very mild ischemia.  Echo 05/2011: EF 30-35%, mod LV dilatation, grade 1 dd, diffuse HK, mild MR, mild LA dilatation.   She was seen by Dr. Tenny Craw in 04/2011 for cardiac clearance of a axillary lymph node biopsy after newly discovered LBBB on EKG. Ischemic testing in the form of the above was recommended, and she was eventually deemed to be an acceptable risk. Pathology indicated benign LNs.   History is limited by the patient's dementia. She is quite limited functionally at home, but is able to complete her ADLs. Per her son and relative, she was getting ready to go to a funeral and was found unresponsive by her bed for 5-10 minutes. She had just awoken from a nap and stood out of bed to get ready. She does not remember much after that. She was found down by her bed. No seizure like activity. She had lost urinary function. She does have rare intermittent episodes of fleeting chest pain. Some shortness of breath which is stable. Episodic palpitations which lasts for a few seconds and resolves. She does get lightheaded after standing from sitting/laying. EMS arrived and HR was noted to be in the 30s.   In the ED, EKG revealed NSR, LBBB unchanged from prior tracings. Unremarkable hed CT.  Hip radiography w/o evidence of fracture. CBC c/w mild pancyopenia. Trop-I did return elevated at 1.08. pBNP 2395. CXR- low lung volumes, no acute process, atherosclerosis. U/a with moderate LE. 2D echo today reveals EF 55%, mild LVH, mild LA dilatation, septal dyssynchroncy from IVCD. Serial troponins: 1.21>0.76>0.74. Carotid dopplers w/o significant abnormalities. MRI and EEG pending.   Problem List: Past Medical History  Diagnosis Date  . Dementia   . Thyroid disease   . Hyperlipidemia   . Breast cancer   . Hypertension     primary Dr. Modesto Charon, saw last Oct 2012,    Past Surgical History  Procedure Laterality Date  . Appendectomy    . Breast surgery       Allergies:  Allergies  Allergen Reactions  . Penicillins     Advised was allergic to penicillin early in life.    Home Medications: Prior to Admission medications   Medication Sig Start Date End Date Taking? Authorizing Provider  amLODipine (NORVASC) 10 MG tablet Take 10 mg by mouth daily.   Yes Historical Provider, MD  aspirin 81 MG tablet Take 81 mg by mouth at bedtime.   Yes Historical Provider, MD  atorvastatin (LIPITOR) 20 MG tablet Take 20 mg by mouth at bedtime.    Yes Historical Provider, MD  carvedilol (COREG) 6.25 MG tablet Take 6.25 mg by mouth 2 (two) times daily with a meal.   Yes Historical Provider, MD  Cholecalciferol (VITAMIN D PO) Take 1 tablet by mouth daily.   Yes Historical Provider, MD  donepezil (ARICEPT) 10 MG  tablet Take 10 mg by mouth at bedtime.    Yes Historical Provider, MD  fenofibrate 160 MG tablet Take 160 mg by mouth daily.   Yes Historical Provider, MD  levothyroxine (SYNTHROID, LEVOTHROID) 75 MCG tablet Take 75 mcg by mouth daily before breakfast.   Yes Historical Provider, MD  lovastatin (MEVACOR) 40 MG tablet Take 80 mg by mouth at bedtime.    Yes Historical Provider, MD  megestrol (MEGACE ES) 625 MG/5ML suspension Take 5 mLs (625 mg total) by mouth daily. 07/16/12  Yes Ileana Ladd, MD    feeding supplement [facility]) LIQD Take 237 mLs by mouth 3 (three) times daily with meals. 04/21/11   Belkys A Regalado, MD    Inpatient Medications:  . amLODipine  10 mg Oral Daily  . aspirin EC  325 mg Oral Daily  . atorvastatin  20 mg Oral QHS  . carvedilol  6.25 mg Oral BID WC  . cholecalciferol  1,000 Units Oral Daily  . ciprofloxacin  400 mg Intravenous Q12H  . donepezil  10 mg Oral QHS  . feeding supplement  237 mL Oral TID WC  . fenofibrate  160 mg Oral Daily  . levothyroxine  75 mcg Oral QAC breakfast  . [START ON 08/24/2012] megestrol  800 mg Oral Daily  . sodium chloride  3 mL Intravenous Q12H   Prescriptions prior to admission  Medication Sig Dispense Refill  . amLODipine (NORVASC) 10 MG tablet Take 10 mg by mouth daily.      Marland Kitchen aspirin 81 MG tablet Take 81 mg by mouth at bedtime.      Marland Kitchen atorvastatin (LIPITOR) 20 MG tablet Take 20 mg by mouth at bedtime.       . carvedilol (COREG) 6.25 MG tablet Take 6.25 mg by mouth 2 (two) times daily with a meal.      . Cholecalciferol (VITAMIN D PO) Take 1 tablet by mouth daily.      Marland Kitchen donepezil (ARICEPT) 10 MG tablet Take 10 mg by mouth at bedtime.       . fenofibrate 160 MG tablet Take 160 mg by mouth daily.      Marland Kitchen levothyroxine (SYNTHROID, LEVOTHROID) 75 MCG tablet Take 75 mcg by mouth daily before breakfast.      . lovastatin (MEVACOR) 40 MG tablet Take 80 mg by mouth at bedtime.       . megestrol (MEGACE ES) 625 MG/5ML suspension Take 5 mLs (625 mg total) by mouth daily.  150 mL  0  . feeding supplement (ENSURE IMMUNE HEALTH) LIQD Take 237 mLs by mouth 3 (three) times daily with meals.  12 Bottle  1    Family History  Problem Relation Age of Onset  . Diabetes Mother   . Coronary artery disease Mother   . Coronary artery disease Father      History   Social History  . Marital Status: Married    Spouse Name: N/A    Number of Children: N/A  . Years of Education: N/A   Occupational History  . Not on file.    Social History Main Topics  . Smoking status: Never Smoker   . Smokeless tobacco: Not on file  . Alcohol Use: No  . Drug Use: No  . Sexually Active:    Other Topics Concern  . Not on file   Social History Narrative  . No narrative on file     Review of Systems:  Limited by dementia.  General: negative for  fevers or chills Cardiovascular: positive for chronic intermittent chest pain, SOB, palpitations, negative foredema, orthopnea, paroxysmal nocturnal dyspnea Respiratory: positive for SOB  Abdominal: negative for abdominal pain, nausea, vomiting Neurologic: positive for dizziness, syncope, negative for visual changes All other systems reviewed and are otherwise negative except as noted above.  Physical Exam: Blood pressure 130/70, pulse 75, temperature 98.7 F (37.1 C), temperature source Oral, resp. rate 20, SpO2 98.00%.  General: Elderly, appears older than stated age, well nourished, in no acute distress. Head: Normocephalic, atraumatic, sclera non-icteric, no xanthomas, nares are without discharge.  Neck: Negative for carotid bruits. JVD not elevated. Lungs: Clear bilaterally to auscultation without wheezes, rales, or rhonchi. Breathing is unlabored. Heart: RRR, II-VI systolic murmur at RUSB/LUSB/LLSB, with S1 S2. No murmurs, rubs, or gallops appreciated. Abdomen: Soft, non-tender, non-distended with normoactive bowel sounds. No hepatomegaly. No rebound/guarding. No obvious abdominal masses. Msk:  Strength and tone appears normal for age. Extremities: No clubbing, cyanosis or edema.  Distal pedal pulses are 2+ and equal bilaterally. Neuro: Alert and oriented X 3. Moves all extremities spontaneously. Psych:  Responds to questions appropriately with a normal affect.  Labs: Recent Labs     08/22/12  1354  08/23/12  0227  WBC  2.5*  2.5*  HGB  9.2*  8.1*  HCT  26.2*  23.7*  MCV  92.9  94.4  PLT  73*  68*   Recent Labs Lab 08/22/12 1354 08/23/12 0227  NA 140  140  K 3.1* 3.1*  CL 106 108  CO2 26 24  BUN 12 11  CREATININE 1.51* 1.34*  CALCIUM 10.3 9.7  GLUCOSE 93 81   Recent Labs     08/22/12  2122  08/23/12  0240  08/23/12  0830  TROPONINI  1.21*  0.76*  0.74*   Recent Labs     08/23/12  0227  CHOL  80  HDL  15*  LDLCALC  39  TRIG  130  CHOLHDL  5.3    Recent Labs  08/22/12 1917  TSH 1.220    Radiology/Studies: Dg Chest 1 View  08/22/2012  *RADIOLOGY REPORT*  Clinical Data: History of trauma from a fall.  CHEST - 1 VIEW  Comparison: Chest x-ray 04/16/2011.  Findings: Lung volumes are slightly low.  No acute consolidative airspace disease.  No pleural effusions.  Pulmonary vasculature is within normal limits.  Heart size appears borderline enlarged (likely accentuated by low lung volume).  Upper mediastinal contours are within normal limits.  Atherosclerosis of the thoracic aorta.  No pneumothorax.  Visualized bony thorax is grossly intact. Numerous surgical clips project over the right breast, suggesting prior lumpectomy.  IMPRESSION: 1.  Low lung volumes without radiographic evidence of acute cardiopulmonary disease. 2.  Atherosclerosis.   Original Report Authenticated By: Trudie Reed, M.D.    Dg Pelvis 1-2 Views  08/22/2012  *RADIOLOGY REPORT*  Clinical Data: History of trauma from a fall.  PELVIS - 1-2 VIEW  Comparison: No priors.  Findings: AP view of the pelvis demonstrates no acute displaced fracture of the bony pelvic ring.  Bilateral proximal femurs as visualized appear intact.  Femoral heads appear to project over the acetabula bilaterally suggesting that they are properly located. Joint space narrowing, subchondral sclerosis, subchondral cyst formation and osteophyte formation is noted in the hip joints bilaterally (right greater than left), with osteoarthritis.  IMPRESSION: 1.  No acute radiographic abnormality of the bony pelvis. 2.  Mild bilateral hip joint osteoarthritis (right greater than left).  Original Report  Authenticated By: Trudie Reed, M.D.    Ct Head Wo Contrast  08/22/2012  *RADIOLOGY REPORT*  Clinical Data:  72 year old female with fall, head and neck injury with loss of consciousness, headache and neck pain.  History of breast cancer.  CT HEAD WITHOUT CONTRAST CT CERVICAL SPINE WITHOUT CONTRAST  Technique:  Multidetector CT imaging of the head and cervical spine was performed following the standard protocol without intravenous contrast.  Multiplanar CT image reconstructions of the cervical spine were also generated.  Comparison:  04/11/2011 head CT  CT HEAD  Findings: Mild chronic small vessel white matter ischemic changes are identified. No acute intracranial abnormalities are identified, including mass lesion or mass effect, hydrocephalus, extra-axial fluid collection, midline shift, hemorrhage, or acute infarction.  The visualized bony calvarium is unremarkable. Right scalp soft tissue swelling is noted.  IMPRESSION: No evidence of acute intracranial abnormality.  Right scalp soft tissue swelling without fracture.  CT CERVICAL SPINE  Findings: Normal alignment is noted. There is no evidence of acute fracture, subluxation or prevertebral soft tissue swelling. Multilevel degenerative disc disease and spondylosis identified, greatest at C4-C5 and C5-C6.  Mild central spinal narrowing throughout the cervical spine is identified. A moderate central disc protrusion at C3-C4 contacts the cord. No other soft tissue abnormalities are identified. No focal bony lesions are present.  IMPRESSION: No static evidence of acute injury to the cervical spine.  Moderate central disc protrusion at C3-C4 contacting the cord.  Multilevel degenerative changes.   Original Report Authenticated By: Harmon Pier, M.D.    Ct Cervical Spine Wo Contrast  08/22/2012  *RADIOLOGY REPORT*  Clinical Data:  72 year old female with fall, head and neck injury with loss of consciousness, headache and neck pain.  History of breast cancer.  CT  HEAD WITHOUT CONTRAST CT CERVICAL SPINE WITHOUT CONTRAST  Technique:  Multidetector CT imaging of the head and cervical spine was performed following the standard protocol without intravenous contrast.  Multiplanar CT image reconstructions of the cervical spine were also generated.  Comparison:  04/11/2011 head CT  CT HEAD  Findings: Mild chronic small vessel white matter ischemic changes are identified. No acute intracranial abnormalities are identified, including mass lesion or mass effect, hydrocephalus, extra-axial fluid collection, midline shift, hemorrhage, or acute infarction.  The visualized bony calvarium is unremarkable. Right scalp soft tissue swelling is noted.  IMPRESSION: No evidence of acute intracranial abnormality.  Right scalp soft tissue swelling without fracture.  CT CERVICAL SPINE  Findings: Normal alignment is noted. There is no evidence of acute fracture, subluxation or prevertebral soft tissue swelling. Multilevel degenerative disc disease and spondylosis identified, greatest at C4-C5 and C5-C6.  Mild central spinal narrowing throughout the cervical spine is identified. A moderate central disc protrusion at C3-C4 contacts the cord. No other soft tissue abnormalities are identified. No focal bony lesions are present.  IMPRESSION: No static evidence of acute injury to the cervical spine.  Moderate central disc protrusion at C3-C4 contacting the cord.  Multilevel degenerative changes.   Original Report Authenticated By: Harmon Pier, M.D.     EKG 08/23/12: NSR, 62 bpm, LBBB, 1st degree AVB, LAD Telemetry: NSR, LBBB, frequent PACs, intermittent 2nd degree Mobitz II block  ASSESSMENT AND PLAN:   72 y.o. female with a PMHx s/f mod-severe dementia, history of LBBB, abnormal Myoview (see below), HLD, HTN, hypothyroidism and h/o breast CA who was admitted yesterday for syncope.   1. Syncope 2. NSTEMI 3. Dementia, moderate to severe 4. Chronic LBBB 5. 2nd  degree AVB, Mobitz type 2 6.  Frequent PACs 7. ? 2:1 AV block 8. History of abnormal stress test 9. Hyperlipidemia 10. HTN  11. Hypothyroidism 12. H/o breast CA 13. CKD, stage III  Difficult to gain an appreciable of the details surrounding the patient's syncopal episode given her moderate to severe underlying dementia. She does have evidence of a 2nd degree AVB, Mobitz II. There are also components that support an orthostatic etiology, simply a mechanical fall out of bed, and seizure activity cannot be definitively ruled out (urinary incontinence, dementia). Plan to hold AVN, allow carvedilol wash-out and monitor for improvement. Treat conservatively. She would be a poor surgical candidate for PPM. Given the patient's history of LBBB and evidence of previous infarct and mild reversible ischemia on Myoview last year, she does likely have underlying CAD. Suspect demand ischemia accounting for NSTEMI. Continue ASA, statin (not sure why on two at home- would continue atorvastatin) and fibrate. Continue IV hydration.   Signed, R. Hurman Horn, PA-C 08/23/2012, 1:45 PM  I have seen, examined the patient, and reviewed the above assessment and plan.  Changes to above are made where necessary.  Pt found down.  History is limited by advanced dementia.  She could have simply fallen, had postural syncope, or another cause.  I do see occasional mobitz II AV block on telemetry though she is presently asymptomatic. I do not see more than 1 consecutive P wave nonconducted.  She is clear that she would like to avoid PPM. We will therefore hold coreg and follow conservatively.  If she continues to have advanced AV block then she may require PPM.  Co Sign: Hillis Range, MD 08/23/2012 3:55 PM

## 2012-08-23 NOTE — Progress Notes (Signed)
[facility] PROGRESS NOTE  Kristen Wheeler RUE:[id number] DOB: [birth year] DOA: 08/22/2012 PCP: Redmond Baseman, MD  Assessment/Plan: 1-Syncope/collapse: unclear etiology at this moment; but concerns includes: neurocardiogenic vs orthostasis vs related to UTI. -carotid dopplers w/o abnormalities. Will follow MRI and EEG -will check B12 level -continue IVf's  -treat for UTI and follow urine culture.  2-elevated troponin: no CP currently; denies SOB as well.  -cardiology has been consulted and will follow rec's. Corinda Gubler) -2-D echo demonstrated no wall motion abnormalities. -continue B-blocker, statins, ASA  3-Acute kidney injury: hypoperfusion during syncope and UTI -will treat infection -continue IVF's -BMET in am  4-Presumed UTI: started empirically on cipro; will follow urine cx  5-hypothyroidism: continue synthroid; TSH WNL.  6-Dementia: moderate and per family at baseline. Will continue supportive care. Donepezil stopped given presence of bradycardia  7-hypokalemia: will replete  DVT: SCD's  Code Status: Full Family Communication: son and husband at bedside Disposition Plan: continue inpatient, telemetry bed and at discharge HHPT   Consultants:  Cardiology  PT  Procedures:  2-D echo (no wall motion abnormalities, no PFO, EF 55%)  Carotid dopplers (no significant ICA stenosis, vertebral flow antegrade)  Antibiotics:  cipro  HPI/Subjective: Afebrile, currently denies any CP; reports no abdominal pain, nausea or vomiting.  Objective: Filed Vitals:   08/23/12 0345 08/23/12 0348 08/23/12 0858 08/23/12 1037  BP: 130/61 138/74  130/70  Pulse: 68 70 75   Temp:      TempSrc:      Resp: 20 20    SpO2: 99% 98%      Intake/Output Summary (Last 24 hours) at 08/23/12 1335 Last data filed at 08/23/12 0900  Gross per 24 hour  Intake    420 ml  Output    602 ml  Net   -182 ml   There were no vitals filed for this visit.  Exam:   General:   NAD, AAOX2, afebrile; reports some chills  Cardiovascular: S1 and S2, positive SEM, no rubs or gallops  Respiratory: no crackles or wheezing  Abdomen: soft, NT, ND, positive BS  Musculoskeletal: 1-2 ++ edema affecting LE (especially feet)  Data Reviewed: Basic Metabolic Panel:  Recent Labs Lab 08/22/12 1354 08/23/12 0227 08/23/12 0826  NA 140 140  --   K 3.1* 3.1*  --   CL 106 108  --   CO2 26 24  --   GLUCOSE 93 81  --   BUN 12 11  --   CREATININE 1.51* 1.34*  --   CALCIUM 10.3 9.7  --   MG  --   --  1.7   CBC:  Recent Labs Lab 08/22/12 1354 08/23/12 0227  WBC 2.5* 2.5*  NEUTROABS 1.6*  --   HGB 9.2* 8.1*  HCT 26.2* 23.7*  MCV 92.9 94.4  PLT 73* 68*   Cardiac Enzymes:  Recent Labs Lab 08/22/12 1515 08/22/12 2122 08/23/12 0240 08/23/12 0830  TROPONINI 1.08* 1.21* 0.76* 0.74*   BNP (last 3 results)  Recent Labs  08/22/12 2122  PROBNP 2395.0*    Studies: Dg Chest 1 View  08/22/2012  *RADIOLOGY REPORT*  Clinical Data: History of trauma from a fall.  CHEST - 1 VIEW  Comparison: Chest x-ray 04/16/2011.  Findings: Lung volumes are slightly low.  No acute consolidative airspace disease.  No pleural effusions.  Pulmonary vasculature is within normal limits.  Heart size appears borderline enlarged (likely accentuated by low lung volume).  Upper mediastinal contours are within normal limits.  Atherosclerosis of the  thoracic aorta.  No pneumothorax.  Visualized bony thorax is grossly intact. Numerous surgical clips project over the right breast, suggesting prior lumpectomy.  IMPRESSION: 1.  Low lung volumes without radiographic evidence of acute cardiopulmonary disease. 2.  Atherosclerosis.   Original Report Authenticated By: Trudie Reed, M.D.    Dg Pelvis 1-2 Views  08/22/2012  *RADIOLOGY REPORT*  Clinical Data: History of trauma from a fall.  PELVIS - 1-2 VIEW  Comparison: No priors.  Findings: AP view of the pelvis demonstrates no acute displaced fracture  of the bony pelvic ring.  Bilateral proximal femurs as visualized appear intact.  Femoral heads appear to project over the acetabula bilaterally suggesting that they are properly located. Joint space narrowing, subchondral sclerosis, subchondral cyst formation and osteophyte formation is noted in the hip joints bilaterally (right greater than left), with osteoarthritis.  IMPRESSION: 1.  No acute radiographic abnormality of the bony pelvis. 2.  Mild bilateral hip joint osteoarthritis (right greater than left).   Original Report Authenticated By: Trudie Reed, M.D.    Ct Head Wo Contrast  08/22/2012  *RADIOLOGY REPORT*  Clinical Data:  72 year old female with fall, head and neck injury with loss of consciousness, headache and neck pain.  History of breast cancer.  CT HEAD WITHOUT CONTRAST CT CERVICAL SPINE WITHOUT CONTRAST  Technique:  Multidetector CT imaging of the head and cervical spine was performed following the standard protocol without intravenous contrast.  Multiplanar CT image reconstructions of the cervical spine were also generated.  Comparison:  04/11/2011 head CT  CT HEAD  Findings: Mild chronic small vessel white matter ischemic changes are identified. No acute intracranial abnormalities are identified, including mass lesion or mass effect, hydrocephalus, extra-axial fluid collection, midline shift, hemorrhage, or acute infarction.  The visualized bony calvarium is unremarkable. Right scalp soft tissue swelling is noted.  IMPRESSION: No evidence of acute intracranial abnormality.  Right scalp soft tissue swelling without fracture.  CT CERVICAL SPINE  Findings: Normal alignment is noted. There is no evidence of acute fracture, subluxation or prevertebral soft tissue swelling. Multilevel degenerative disc disease and spondylosis identified, greatest at C4-C5 and C5-C6.  Mild central spinal narrowing throughout the cervical spine is identified. A moderate central disc protrusion at C3-C4 contacts the  cord. No other soft tissue abnormalities are identified. No focal bony lesions are present.  IMPRESSION: No static evidence of acute injury to the cervical spine.  Moderate central disc protrusion at C3-C4 contacting the cord.  Multilevel degenerative changes.   Original Report Authenticated By: Harmon Pier, M.D.    Ct Cervical Spine Wo Contrast  08/22/2012  *RADIOLOGY REPORT*  Clinical Data:  72 year old female with fall, head and neck injury with loss of consciousness, headache and neck pain.  History of breast cancer.  CT HEAD WITHOUT CONTRAST CT CERVICAL SPINE WITHOUT CONTRAST  Technique:  Multidetector CT imaging of the head and cervical spine was performed following the standard protocol without intravenous contrast.  Multiplanar CT image reconstructions of the cervical spine were also generated.  Comparison:  04/11/2011 head CT  CT HEAD  Findings: Mild chronic small vessel white matter ischemic changes are identified. No acute intracranial abnormalities are identified, including mass lesion or mass effect, hydrocephalus, extra-axial fluid collection, midline shift, hemorrhage, or acute infarction.  The visualized bony calvarium is unremarkable. Right scalp soft tissue swelling is noted.  IMPRESSION: No evidence of acute intracranial abnormality.  Right scalp soft tissue swelling without fracture.  CT CERVICAL SPINE  Findings: Normal alignment is noted. There  is no evidence of acute fracture, subluxation or prevertebral soft tissue swelling. Multilevel degenerative disc disease and spondylosis identified, greatest at C4-C5 and C5-C6.  Mild central spinal narrowing throughout the cervical spine is identified. A moderate central disc protrusion at C3-C4 contacts the cord. No other soft tissue abnormalities are identified. No focal bony lesions are present.  IMPRESSION: No static evidence of acute injury to the cervical spine.  Moderate central disc protrusion at C3-C4 contacting the cord.  Multilevel  degenerative changes.   Original Report Authenticated By: Harmon Pier, M.D.     Scheduled Meds: . amLODipine  10 mg Oral Daily  . aspirin EC  325 mg Oral Daily  . atorvastatin  20 mg Oral QHS  . carvedilol  6.25 mg Oral BID WC  . cholecalciferol  1,000 Units Oral Daily  . ciprofloxacin  400 mg Intravenous Q12H  . donepezil  10 mg Oral QHS  . feeding supplement  237 mL Oral TID WC  . fenofibrate  160 mg Oral Daily  . levothyroxine  75 mcg Oral QAC breakfast  . [START ON 08/24/2012] megestrol  800 mg Oral Daily  . sodium chloride  3 mL Intravenous Q12H   Continuous Infusions: . sodium chloride 1,000 mL (08/23/12 1325)    Principal Problem:   Syncope Active Problems:   Hypertension   Thyroid disease   Dementia   Dehydration   Troponin level elevated   Acute kidney injury   Other pancytopenia    Time spent: >30 minutes   Ryden Wainer  [facility] Pager [phone]. If 7PM-7AM, please contact night-coverage at [email or web address], password [facility] 08/23/2012, 1:35 PM  LOS: 1 day

## 2012-08-23 NOTE — Progress Notes (Signed)
Physical Therapy Evaluation Patient Details Name: Kristen Wheeler MRN: [id number] DOB: 16-Feb-1941 Today's Date: 08/23/2012 Time: 2130-8657 PT Time Calculation (min): 33 min  PT Assessment / Plan / Recommendation Clinical Impression  Pt is 72 yo female with syncopal episode which she attributes to the stress of losing her aunt and preparing for that funeral. However, she has been frequently bradycardic since admit. After ambulation, HE was 75 bpm, in A-fib. Pt with gait imbalance and sigfificantly increased fall risk, especially given her dementia. Recommend 24 hr supervision at home and HHPT for balance program. PT will follow acutely to begin working on balance.    PT Assessment  Patient needs continued PT services    Follow Up Recommendations  Home health PT;Supervision/Assistance - 24 hour    Does the patient have the potential to tolerate intense rehabilitation      Barriers to Discharge None      Equipment Recommendations  None recommended by PT    Recommendations for Other Services     Frequency Min 3X/week    Precautions / Restrictions Precautions Precautions: Fall Restrictions Weight Bearing Restrictions: No   Pertinent Vitals/Pain HR 75 bpm, A-fib, after ambulation      Mobility  Bed Mobility Bed Mobility: Supine to Sit;Sitting - Scoot to Edge of Bed Supine to Sit: 6: Modified independent (Device/Increase time);HOB flat Sitting - Scoot to Edge of Bed: 6: Modified independent (Device/Increase time) Transfers Transfers: Sit to Stand;Stand to Sit Sit to Stand: From toilet;From bed;4: Min guard;With upper extremity assist Stand to Sit: 5: Supervision;To toilet;To chair/3-in-1 Details for Transfer Assistance: min-guard for safety as pt slightly unsteady upon initial standing Ambulation/Gait Ambulation/Gait Assistance: 4: Min guard Ambulation Distance (Feet): 200 Feet Assistive device: None Ambulation/Gait Assistance Details: pt with no overt LOB but unsteady  with ambulation, occasional sidestep for compensation, she reports that she is often unsteady with ambulation but is very careful because she does not want to fall Gait Pattern: Step-through pattern;Wide base of support Gait velocity: wfl Stairs: Yes Stairs Assistance: 5: Supervision Stairs Assistance Details (indicate cue type and reason): pt ascends with alternating step pattern and descends with step-to pattern, seems to have some mild knee OA and discomfort but she says she is just being cautious Stair Management Technique: One rail Right;Forwards Number of Stairs: 4 Wheelchair Mobility Wheelchair Mobility: No    Exercises     PT Diagnosis: Abnormality of gait;Altered mental status  PT Problem List: Decreased balance;Decreased cognition;Decreased safety awareness;Decreased knowledge of precautions PT Treatment Interventions: Gait training;Functional mobility training;Stair training;Therapeutic activities;Therapeutic exercise;Balance training;Cognitive remediation;Patient/family education   PT Goals Acute Rehab PT Goals PT Goal Formulation: With patient Time For Goal Achievement: 09/06/12 Potential to Achieve Goals: Good Pt will go Sit to Stand: with modified independence PT Goal: Sit to Stand - Progress: Goal set today Pt will go Stand to Sit: with modified independence PT Goal: Stand to Sit - Progress: Goal set today Pt will Ambulate: >150 feet;with modified independence;with supervision PT Goal: Ambulate - Progress: Goal set today Additional Goals Additional Goal #1: pt to perform >45/56 on Berg PT Goal: Additional Goal #1 - Progress: Goal set today  Visit Information  Last PT Received On: 08/23/12 Assistance Needed: +1    Subjective Data  Subjective: my son lives with [place] Patient Stated Goal: return home   Prior Functioning  Home Living Lives With: Spouse;Son Available Help at Discharge: Family;Available 24 hours/day Type of Home: House Home Access: Stairs to  enter [employer] of Steps: 2 Entrance  Stairs-Rails: None Home Layout: One level Bathroom Shower/Tub: Network engineer: None Additional Comments: pt generally mobile and independent at home Prior Function Level of Independence: Independent Able to Take Stairs?: Yes Driving: No Vocation: Retired Comments: pt says that she can drive but her husband doesn't let her drive. When asked is she has passed out before, she said she did once a few years ago in the 8th grade when singing with the [employer] Communication Communication: No difficulties    Cognition  Cognition Arousal/Alertness: Awake/alert Behavior During Therapy: WFL for tasks assessed/performed Overall Cognitive Status: History of cognitive impairments - at baseline    Extremity/Trunk Assessment Right Upper Extremity Assessment RUE ROM/Strength/Tone: [facility] for tasks assessed Left Upper Extremity Assessment LUE ROM/Strength/Tone: WFL for tasks assessed Right Lower Extremity Assessment RLE ROM/Strength/Tone: WFL for tasks assessed RLE Sensation: WFL - Light Touch;WFL - Proprioception RLE Coordination: WFL - gross motor Left Lower Extremity Assessment LLE ROM/Strength/Tone: WFL for tasks assessed LLE Sensation: WFL - Light Touch;WFL - Proprioception LLE Coordination: WFL - gross motor Trunk Assessment Trunk Assessment: Normal   Balance Balance Balance Assessed: Yes Dynamic Standing Balance Dynamic Standing - Balance Support: No upper extremity supported;During functional activity Dynamic Standing - Level of Assistance: 4: Min assist (min-guard)  End of Session PT - End of Session Equipment Utilized During Treatment: Gait belt Activity Tolerance: Patient tolerated treatment well Patient left: in chair;with chair alarm set;with call bell/phone within reach Nurse Communication: Mobility status  GP   Lyanne Co, PT  Acute Rehab Services   [phone]   Lyanne Co 08/23/2012, 9:12 AM

## 2012-08-24 ENCOUNTER — Telehealth: Payer: Self-pay | Admitting: Family Medicine

## 2012-08-24 ENCOUNTER — Other Ambulatory Visit: Payer: Self-pay | Admitting: Family Medicine

## 2012-08-24 DIAGNOSIS — E860 Dehydration: Secondary | ICD-10-CM

## 2012-08-24 DIAGNOSIS — E785 Hyperlipidemia, unspecified: Secondary | ICD-10-CM

## 2012-08-24 LAB — CBC
HCT: 24.6 % — ABNORMAL LOW (ref 36.0–46.0)
Hemoglobin: 8.4 g/dL — ABNORMAL LOW (ref 12.0–15.0)
WBC: 2.2 10*3/uL — ABNORMAL LOW (ref 4.0–10.5)

## 2012-08-24 LAB — BASIC METABOLIC PANEL
BUN: 12 mg/dL (ref 6–23)
Chloride: 110 mEq/L (ref 96–112)
GFR calc Af Amer: 41 mL/min — ABNORMAL LOW (ref >90)
Potassium: 3.5 mEq/L (ref 3.5–5.1)

## 2012-08-24 LAB — HEMOGLOBIN A1C: Mean Plasma Glucose: 82 mg/dL (ref <117)

## 2012-08-24 MED ORDER — MEMANTINE HCL 5 MG PO TABS
5.0000 mg | ORAL_TABLET | Freq: Every day | ORAL | Status: DC
  Administered 2012-08-24 – 2012-08-25 (×2): 5 mg via ORAL
  Filled 2012-08-24 (×2): qty 1

## 2012-08-24 MED ORDER — VITAMIN B-12 1000 MCG PO TABS
1000.0000 ug | ORAL_TABLET | Freq: Every day | ORAL | Status: DC
  Administered 2012-08-24 – 2012-08-25 (×2): 1000 ug via ORAL
  Filled 2012-08-24 (×2): qty 1

## 2012-08-24 NOTE — Progress Notes (Signed)
SUBJECTIVE: The patient is doing well today.  At this time, she denies chest pain, shortness of breath, or any new concerns.  Marland Kitchen amLODipine  10 mg Oral Daily  . aspirin EC  325 mg Oral Daily  . atorvastatin  20 mg Oral QHS  . cholecalciferol  1,000 Units Oral Daily  . ciprofloxacin  400 mg Intravenous Q12H  . donepezil  10 mg Oral QHS  . feeding supplement  237 mL Oral TID WC  . fenofibrate  160 mg Oral Daily  . levothyroxine  75 mcg Oral QAC breakfast  . megestrol  800 mg Oral Daily  . sodium chloride  3 mL Intravenous Q12H   . sodium chloride 1,000 mL (08/23/12 1325)    OBJECTIVE: Physical Exam: Filed Vitals:   08/23/12 1037 08/23/12 1400 08/23/12 2100 08/24/12 0447  BP: 130/70 123/84 132/71 147/76  Pulse:  65 83 80  Temp:  98.7 F (37.1 C) 98.7 F (37.1 C) 98.4 F (36.9 C)  TempSrc:  Oral Oral Oral  Resp:  18 20 20   SpO2:  100% 100% 97%    Intake/Output Summary (Last 24 hours) at 08/24/12 0741 Last data filed at 08/24/12 0451  Gross per 24 hour  Intake    780 ml  Output   1250 ml  Net   -470 ml    Telemetry reveals sinus rhythm with occasional PVCs, second degree AV block has improved  GEN- The patient is well appearing, alert but with confusion Head- ecchymosis over R eye/ forehead Eyes-  Sclera clear, conjunctiva pink Ears- hearing intact Oropharynx- clear Neck- supple, no JVP Lymph- no cervical lymphadenopathy Lungs- Clear to ausculation bilaterally, normal work of breathing Heart- Regular rate and rhythm, 2/6 SEM LLSB GI- soft, NT, ND, + BS Extremities- no clubbing, cyanosis, or edema Skin- no rash or lesion Psych- euthymic mood, full affect, confused at times Neuro- strength and sensation are intact  LABS: Basic Metabolic Panel:  Recent Labs  96/04/54 1354 08/23/12 0227 08/23/12 0826  NA 140 140  --   K 3.1* 3.1*  --   CL 106 108  --   CO2 26 24  --   GLUCOSE 93 81  --   BUN 12 11  --   CREATININE 1.51* 1.34*  --   CALCIUM 10.3 9.7   --   MG  --   --  1.7   Liver Function Tests: No results found for this basename: AST, ALT, ALKPHOS, BILITOT, PROT, ALBUMIN,  in the last 72 hours No results found for this basename: LIPASE, AMYLASE,  in the last 72 hours CBC:  Recent Labs  08/22/12 1354 08/23/12 0227 08/24/12 0619  WBC 2.5* 2.5* 2.2*  NEUTROABS 1.6*  --   --   HGB 9.2* 8.1* 8.4*  HCT 26.2* 23.7* 24.6*  MCV 92.9 94.4 95.0  PLT 73* 68* 66*   Cardiac Enzymes:  Recent Labs  08/22/12 2122 08/23/12 0240 08/23/12 0830  TROPONINI 1.21* 0.76* 0.74*   BNP: No components found with this basename: POCBNP,  D-Dimer: No results found for this basename: DDIMER,  in the last 72 hours Hemoglobin A1C: No results found for this basename: HGBA1C,  in the last 72 hours Fasting Lipid Panel:  Recent Labs  08/23/12 0227  CHOL 80  HDL 15*  LDLCALC 39  TRIG 098  CHOLHDL 5.3   Thyroid Function Tests:  Recent Labs  08/22/12 1917  TSH 1.220   Anemia Panel: No results found for this basename: VITAMINB12,  FOLATE, FERRITIN, TIBC, IRON, RETICCTPCT,  in the last 72 hours   ASSESSMENT AND PLAN:  Principal Problem:   Syncope Active Problems:   Hypertension   Thyroid disease   Dementia   Dehydration   Troponin level elevated   Acute kidney injury   Other pancytopenia  72 y.o. female with a PMHx s/f mod-severe dementia, history of LBBB, HTN, hypothyroidism and h/o breast CA who was admitted with syncope.  1. Syncope  2. NSTEMI  3. Dementia, moderate to severe  4. Chronic LBBB  5. 2nd degree AVB, Mobitz type 2  6. Frequent PACs  7. ? 2:1 AV block  8. History of abnormal stress test  9. Hyperlipidemia  10. HTN  11. Hypothyroidism  12. H/o breast CA  13. CKD, stage III  Her second degree AV block is much improved off of coreg.  Would stop coreg long term.  She would like to avoid PPM or invasive procedures.  We will follow conservatively for now.  Elevated troponin was likely demand ischemia event.  She  denies anginal symptoms.  Will therefore not proceed with cath at this time.   Pancytopenia is noted with significant anemia.  This anemia could be the cause for her syncope and elevated troponin. If rhythm remains stable, no further inpatient procedures are planned.  She could have an event monitor placed for 30 days to further evaluate her conduction.    Hillis Range, MD 08/24/2012 7:41 AM

## 2012-08-24 NOTE — Progress Notes (Signed)
Pt began yelling and verbally abusive to staff and attempting to hit staff.  Confused and states "where am I. I want to go home now."  I don't belong here."  Attempts to calm patient unsuccessful.  Spoke with pt's family regarding behavior and patient spoke with family in attempt to calm patient. Attempt to calm patient unsuccessful. Night hospitalist notified.  Order received for IV ativan.  Ativan 0.5mg  IV given.  Will continue to monitor patient.

## 2012-08-24 NOTE — Procedures (Signed)
EEG report.  Brief clinical history: 72 years old with syncope/collapse of unclear etiology at this moment. No prior history of frank epileptic seizures.  Technique: this is a 17 channel routine scalp EEG performed at the bedside with bipolar and monopolar montages arranged in accordance to the international 10/20 system of electrode placement. One channel was dedicated to EKG recording.  The study was performed during wakefulness, drowsiness, and stage 2 sleep. No activating procedures employed during the test.  Description:In the wakeful state, the best background consisted of a medium amplitude, posterior dominant, well sustained, symmetric and reactive 10 Hz rhythm. Drowsiness demonstrated dropout of the alpha rhythm. Stage 2 sleep showed symmetric and synchronous sleep spindles without intermixed epileptiform discharges. No focal or generalized epileptiform discharges noted.  No slowing seen.  EKG showed sinus rhythm.  Impression: this is a normal awake and asleep EEG. Please, be aware that a normal EEG does not exclude the possibility of epilepsy.  Clinical correlation is advised.  Wyatt Portela, MD

## 2012-08-24 NOTE — Progress Notes (Signed)
CSW received referral for SNF placement. CSW will meet with the pt and/or family to search for placement.  Ancil Dewan, LCSW-A Clinical Social Worker [phone]   

## 2012-08-24 NOTE — Progress Notes (Signed)
EEG completed.

## 2012-08-24 NOTE — Progress Notes (Signed)
[facility] PROGRESS NOTE  Kristen Wheeler ZOX:[id number] DOB: [birth year] DOA: 08/22/2012 PCP: Redmond Baseman, MD  Assessment/Plan: 1-Syncope/collapse: unclear etiology at this moment; but concerns includes: neurocardiogenic vs orthostasis vs related to UTI. -carotid dopplers w/o abnormalities. -MRI and EEG WNL -B12 borderline normal. Will provided oral supplementation. -continue treatment for UTI   2-elevated troponin: no CP currently; denies SOB as well.  -cardiology has been consulted and will follow rec's. Corinda Gubler) -2-D echo demonstrated no wall motion abnormalities. -continue statins and ASA -B-blocker discontinue due to bradycardia and intermittent mobitz II on telemetry.  3-Acute kidney injury: hypoperfusion during syncope and UTI -will treat infection -continue IVF's -follow Cr trend.  4-Uncomplicated E.coli UTI: started empirically on cipro; will follow urine cx sensitivity. (patient allergic to Penicillins)  5-hypothyroidism: continue synthroid; TSH WNL.  6-Dementia: moderate and per family at baseline. Will continue supportive care. Donepezil stopped given presence of bradycardia. -will use namenda instead.  7-hypokalemia:repleted. BMET in am.  8-Bradycardia: rate better after stopping donepezil and B-blockers.  DVT: SCD's  Code Status: Full Family Communication: son and husband at bedside Disposition Plan: continue inpatient, telemetry bed and at discharge HHPT   Consultants:  Cardiology  PT  Procedures:  2-D echo (no wall motion abnormalities, no PFO, EF 55%)  Carotid dopplers (no significant ICA stenosis, vertebral flow antegrade)  Antibiotics:  cipro  HPI/Subjective: Afebrile, no CP, no SOB. Patient feeling better and denying lightheadedness.  Objective: Filed Vitals:   08/23/12 1400 08/23/12 2100 08/24/12 0447 08/24/12 1330  BP: 123/84 132/71 147/76 130/66  Pulse: 65 83 80 80  Temp: 98.7 F (37.1 C) 98.7 F (37.1 C) 98.4  F (36.9 C) 99 F (37.2 C)  TempSrc: Oral Oral Oral   Resp: 18 20 20 17   SpO2: 100% 100% 97% 100%    Intake/Output Summary (Last 24 hours) at 08/24/12 1835 Last data filed at 08/24/12 1600  Gross per 24 hour  Intake    480 ml  Output   2050 ml  Net  -1570 ml   There were no vitals filed for this visit.  Exam:   General:  NAD, AAOX2, afebrile; denies CP or SOB  Cardiovascular: S1 and S2, positive SEM, no rubs or gallops  Respiratory: no crackles or wheezing  Abdomen: soft, NT, ND, positive BS  Musculoskeletal: 1 ++ edema affecting LE (especially feet)  Data Reviewed: Basic Metabolic Panel:  Recent Labs Lab 08/22/12 1354 08/23/12 0227 08/23/12 0826 08/24/12 0619  NA 140 140  --  139  K 3.1* 3.1*  --  3.5  CL 106 108  --  110  CO2 26 24  --  25  GLUCOSE 93 81  --  109*  BUN 12 11  --  12  CREATININE 1.51* 1.34*  --  1.45*  CALCIUM 10.3 9.7  --  9.9  MG  --   --  1.7  --    CBC:  Recent Labs Lab 08/22/12 1354 08/23/12 0227 08/24/12 0619  WBC 2.5* 2.5* 2.2*  NEUTROABS 1.6*  --   --   HGB 9.2* 8.1* 8.4*  HCT 26.2* 23.7* 24.6*  MCV 92.9 94.4 95.0  PLT 73* 68* 66*   Cardiac Enzymes:  Recent Labs Lab 08/22/12 1515 08/22/12 2122 08/23/12 0240 08/23/12 0830  TROPONINI 1.08* 1.21* 0.76* 0.74*   BNP (last 3 results)  Recent Labs  08/22/12 2122  PROBNP 2395.0*    Studies: Mr Brain Wo Contrast  08/23/2012  *RADIOLOGY REPORT*  Clinical Data: Syncope.  Breast  cancer history.  Hypertension.  MRI HEAD WITHOUT CONTRAST  Technique:  Multiplanar, multiecho pulse sequences of the brain and surrounding structures were obtained according to standard protocol without intravenous contrast.  Comparison: CT head 08/22/2012  Findings: Negative for acute infarct.  Mild chronic microvascular ischemic change in the white matter.  Basal ganglia and brainstem are normal.  Negative for hemorrhage or fluid collection.  No shift to the midline structures.  Negative for  mass lesion.  Mild mucosal edema in the paranasal sinuses.  IMPRESSION: Mild chronic microvascular ischemic change in the white matter.  No acute abnormality.   Original Report Authenticated By: Janeece Riggers, M.D.     Scheduled Meds: . amLODipine  10 mg Oral Daily  . aspirin EC  325 mg Oral Daily  . atorvastatin  20 mg Oral QHS  . cholecalciferol  1,000 Units Oral Daily  . ciprofloxacin  400 mg Intravenous Q12H  . feeding supplement  237 mL Oral TID WC  . fenofibrate  160 mg Oral Daily  . levothyroxine  75 mcg Oral QAC breakfast  . megestrol  800 mg Oral Daily  . memantine  5 mg Oral Daily  . sodium chloride  3 mL Intravenous Q12H   Continuous Infusions: . sodium chloride 1,000 mL (08/23/12 1325)    Principal Problem:   Syncope Active Problems:   Hypertension   Thyroid disease   Dementia   Dehydration   Troponin level elevated   Acute kidney injury   Other pancytopenia    Time spent: >30 minutes   [name]  [facility] Pager [phone]. If 7PM-7AM, please contact night-coverage at [email or web address], password [email or web address] 08/24/2012, 6:35 PM  LOS: 2 days

## 2012-08-24 NOTE — Telephone Encounter (Signed)
Spoke with daughter and informed since pt in hosp she would need to speak with her social worker and let the social worker advise her  [name] verbalized understanding.

## 2012-08-25 ENCOUNTER — Telehealth: Payer: Self-pay | Admitting: *Deleted

## 2012-08-25 DIAGNOSIS — D61818 Other pancytopenia: Secondary | ICD-10-CM

## 2012-08-25 LAB — URINE CULTURE

## 2012-08-25 LAB — BASIC METABOLIC PANEL
CO2: 23 mEq/L (ref 19–32)
Chloride: 110 mEq/L (ref 96–112)
Glucose, Bld: 96 mg/dL (ref 70–99)
Potassium: 3.6 mEq/L (ref 3.5–5.1)
Sodium: 139 mEq/L (ref 135–145)

## 2012-08-25 MED ORDER — MEMANTINE HCL 5 MG PO TABS: 5.0000 mg | ORAL_TABLET | Freq: Every day | ORAL | Status: DC

## 2012-08-25 MED ORDER — CIPROFLOXACIN HCL 250 MG PO TABS: 250.0000 mg | ORAL_TABLET | Freq: Two times a day (BID) | ORAL | Status: AC

## 2012-08-25 MED ORDER — CYANOCOBALAMIN 1000 MCG PO TABS: 1000.0000 ug | ORAL_TABLET | Freq: Every day | ORAL | Status: DC

## 2012-08-25 MED ORDER — CIPROFLOXACIN HCL 250 MG PO TABS
250.0000 mg | ORAL_TABLET | Freq: Two times a day (BID) | ORAL | Status: DC
  Filled 2012-08-25 (×2): qty 1

## 2012-08-25 NOTE — Progress Notes (Signed)
Patient ID: Kristen Wheeler, female   DOB: Sep 09, 1940, 72 y.o.   MRN: [id number] Subjective:  No chest pain or sob.   Objective:  Vital Signs in the last 24 hours: Temp:  [98.7 F (37.1 C)-99 F (37.2 C)] 98.7 F (37.1 C) (04/29 0509) Pulse Rate:  [80-93] 86 (04/29 0509) Resp:  [17-20] 20 (04/29 0509) BP: (130-143)/(64-66) 143/65 mmHg (04/29 0509) SpO2:  [99 %-100 %] 99 % (04/29 0509)  Intake/Output from previous day: 04/28 0701 - 04/29 0700 In: 1220 [P.O.:720; I.V.:300; IV Piggyback:200] Out: 2300 [Urine:2300] Intake/Output from this shift:    Physical Exam: stable appearing 72 yo woman, looks older than stated age, NAD HEENT: Unremarkable except for ecchymosis over right eye Neck:  7 cm JVD, no thyromegally Lungs:  Clear with no wheezes HEART:  Regular rate rhythm, no murmurs, no rubs, no clicks Abd:  soft, positive bowel sounds, no organomegally, no rebound, no guarding Ext:  2 plus pulses, no edema, no cyanosis, no clubbing Skin:  No rashes no nodules Neuro:  CN II through XII intact, motor grossly intact  Lab Results:  Recent Labs  08/23/12 0227 08/24/12 0619  WBC 2.5* 2.2*  HGB 8.1* 8.4*  PLT 68* 66*    Recent Labs  08/24/12 0619 08/25/12 0602  NA 139 139  K 3.5 3.6  CL 110 110  CO2 25 23  GLUCOSE 109* 96  BUN 12 12  CREATININE 1.45* 1.47*    Recent Labs  08/23/12 0240 08/23/12 0830  TROPONINI 0.76* 0.74*   Hepatic Function Panel No results found for this basename: PROT, ALBUMIN, AST, ALT, ALKPHOS, BILITOT, BILIDIR, IBILI,  in the last 72 hours  Recent Labs  08/23/12 0227  CHOL 80   No results found for this basename: PROTIME,  in the last 72 hours  Imaging: Mr Brain Wo Contrast  08/23/2012  *RADIOLOGY REPORT*  Clinical Data: Syncope.  Breast cancer history.  Hypertension.  MRI HEAD WITHOUT CONTRAST  Technique:  Multiplanar, multiecho pulse sequences of the brain and surrounding structures were obtained according to standard protocol  without intravenous contrast.  Comparison: CT head 08/22/2012  Findings: Negative for acute infarct.  Mild chronic microvascular ischemic change in the white matter.  Basal ganglia and brainstem are normal.  Negative for hemorrhage or fluid collection.  No shift to the midline structures.  Negative for mass lesion.  Mild mucosal edema in the paranasal sinuses.  IMPRESSION: Mild chronic microvascular ischemic change in the white matter.  No acute abnormality.   Original Report Authenticated By: Janeece Riggers, M.D.     Cardiac Studies: Tele - nsr with lbbb Assessment/Plan:  1. Syncope 2. LBBB 3. 2:1 AV block, resolved after cessation of beta blockers Rec: her conduction is back to baseline. Would not recommend PPM at this time. She should not be on any AV nodal blocking drugs. 30 day monitor can be arranged as an outpatient. She will need the etiology of her fairly marked pancytopenia worked up if not already. Gregg Taylor,M.D.  LOS: 3 days    Lewayne Bunting 08/25/2012, 9:23 AM

## 2012-08-25 NOTE — Discharge Summary (Signed)
Physician Discharge Summary  Darcia Lampi Bornhorst ZOX:[id number] DOB: Jan 30, 1941 DOA: 08/22/2012  PCP: Redmond Baseman, MD  Admit date: 08/22/2012 Discharge date: 08/25/2012  Time spent: >30 minutes  Recommendations for Outpatient Follow-up:  -CBC to follow pancytopenia; if Hgb < 8.0 will require PRBC's transfusion -BMET to follow electrolytes and kidney function -Reassess BP and adjust medications as needed -Event monitor by cardiology [facility] cardiology office will contact patient with appointment details)  Discharge Diagnoses:    Syncope   Hypertension   Thyroid disease   Dementia   Dehydration   Troponin level elevated   Acute kidney injury   Other pancytopenia   E.coli UTI   Discharge Condition: stable and with mentation at baseline. No lightheadedness or dizziness. Denies CP or SOB.  Diet recommendation: heart healthy diet  Filed Weights   08/25/12 0509  Weight: 72.6 kg (160 lb 0.9 oz)    History of present illness:  72 year old female wit history of dementia, hypothyroidism, hyperlipidemia, hypertension and history of breast cancer who was getting ready to go to a funeral when she was found to be on the floor next to the bed. As per her son who found her lying on the floor she was sitting at the edge of the bed dressing up. She was unresponsive for almost 5-10 minutes until EMS arrived. Her son informs that her heart rate was in the 30s when EMS checked it. She then woke up and was not in any distress. No witnessed seizure for focal weakness noted. Recent denied any symptoms and does not recall the event. She did not have any chest pain, palpitations, shortness of breath, nausea, vomiting, complaining of abdominal pain, dizziness, bloody vision headache, bowel or urinary symptoms. Patient has a good appetite and family did not think she was dehydrated. At baseline she is usually home bound and minimally ambulates.   Hospital Course:  1-Syncope/collapse: unclear etiology at  this moment; but concerns includes: neurocardiogenic vs orthostasis vs related to UTI.  -carotid dopplers w/o abnormalities.  -MRI and EEG WNL  -B12 borderline normal. Will provided oral supplementation.  -continue treatment for UTI   2-elevated troponin: no CP currently; denies SOB as well.  -cardiology has been consulted and will follow rec's. Corinda Gubler)  -2-D echo demonstrated no wall motion abnormalities.  -continue statins and ASA  -B-blocker discontinue due to bradycardia and intermittent mobitz II on telemetry.   3-Acute kidney injury: hypoperfusion during syncope and UTI  -will treat infection  -encourage good hydration -follow Cr trend with BMET during follow up with PCP. -at discharge Cr 1.4  4-Uncomplicated E.coli UTI: started empirically on cipro. No dysuria.  5-hypothyroidism: continue synthroid; TSH WNL.   6-Dementia: moderate and per family at baseline. Will continue supportive care. Donepezil stopped given presence of bradycardia.  -will use namenda instead. Medication to be adjusted by PCP.  7-hypokalemia: repleted. BMET to be follow at PCP office   8-Bradycardia with no sustained mobitz II: rate better after stopping donepezil and B-blockers. Will avoid this medications in the future. Event monitor per cardiology as an outpatient.   9-Pancytopenia: further workup to be done per PCP at discharge, in case has not been done already.   Procedures: 2-D echo (- Left ventricle: Septal dyssynergy from IVCD. The cavity size was mildly dilated. Wall thickness was increased in a pattern of mild LVH. The estimated ejection fraction was 55%. - Left atrium: The atrium was mildly dilated. - Right ventricle: The cavity size was normal. Systolic function was normal.)  Carotid  dopplers: Right: mild soft plaque distal CCA. Left: Mild calcific plaque origin ICA and mild soft plaque proximal ICA. Bilateral: no signficant ICA stenosis. Vertebral artery flow is antegrade. ICA/CCA  ratio: R-1.0 L-0.89  EEG: WNL  Consultations:  Cardiology  PT  Discharge Exam: Filed Vitals:   08/24/12 0447 08/24/12 1330 08/24/12 2100 08/25/12 0509  BP: 147/76 130/66 137/64 143/65  Pulse: 80 80 93 86  Temp: 98.4 F (36.9 C) 99 F (37.2 C) 98.9 F (37.2 C) 98.7 F (37.1 C)  TempSrc: Oral  Oral Oral  Resp: 20 17 18 20   Height:    5' 4.96" (1.65 m)  Weight:    72.6 kg (160 lb 0.9 oz)  SpO2: 97% 100% 100% 99%   General: NAD, AAOX2, afebrile; denies CP or SOB  Cardiovascular: S1 and S2, positive SEM, no rubs or gallops  Respiratory: no crackles or wheezing  Abdomen: soft, NT, ND, positive BS  Musculoskeletal: 1 ++ edema affecting LE (especially feet)   Discharge Instructions  Discharge Orders   Future Appointments Provider Department Dept Phone   11/13/2012 3:40 PM Ileana Ladd, MD [facility] [phone]   Future Orders Complete By Expires     Diet - low sodium heart healthy  As directed     Discharge instructions  As directed     Comments:      Take medications as prescribed Follow with PCP in 2 weeks Follow up with cardiology for event monitor after discharge (office will call to with appointment details)    Increase activity slowly  As directed         Medication List    STOP taking these medications       carvedilol 6.25 MG tablet  Commonly known as:  COREG     donepezil 10 MG tablet  Commonly known as:  ARICEPT     lovastatin 40 MG tablet  Commonly known as:  MEVACOR      TAKE these medications       amLODipine 10 MG tablet  Commonly known as:  NORVASC  Take 10 mg by mouth daily.     aspirin 81 MG tablet  Take 81 mg by mouth at bedtime.     atorvastatin 20 MG tablet  Commonly known as:  LIPITOR  Take 20 mg by mouth at bedtime.     ciprofloxacin 250 MG tablet  Commonly known as:  CIPRO  Take 1 tablet (250 mg total) by mouth 2 (two) times daily.     cyanocobalamin 1000 MCG tablet  Take 1 tablet (1,000 mcg  total) by mouth daily.     feeding supplement Liqd  Take 237 mLs by mouth 3 (three) times daily with meals.     fenofibrate 160 MG tablet  Take 160 mg by mouth daily.     levothyroxine 75 MCG tablet  Commonly known as:  SYNTHROID, LEVOTHROID  Take 75 mcg by mouth daily before breakfast.     megestrol 625 MG/5ML suspension  Commonly known as:  MEGACE ES  Take 5 mLs (625 mg total) by mouth daily.     memantine 5 MG tablet  Commonly known as:  NAMENDA  Take 1 tablet (5 mg total) by mouth daily.     VITAMIN D PO  Take 1 tablet by mouth daily.           Follow-up Information   Follow up with Redmond Baseman, MD. Schedule an appointment as soon as possible for a visit in  2 weeks.   Contact information:   [address] [place] [address] [phone]       Follow up with [facility] Main [facility]). (office will call to arrange follow up visit for event monitor test)    Contact information:   [address], [address] [place] [address] [phone]       The results of significant diagnostics from this hospitalization (including imaging, microbiology, ancillary and laboratory) are listed below for reference.    Significant Diagnostic Studies: Dg Chest 1 View  08/22/2012  *RADIOLOGY REPORT*  Clinical Data: History of trauma from a fall.  CHEST - 1 VIEW  Comparison: Chest x-ray 04/16/2011.  Findings: Lung volumes are slightly low.  No acute consolidative airspace disease.  No pleural effusions.  Pulmonary vasculature is within normal limits.  Heart size appears borderline enlarged (likely accentuated by low lung volume).  Upper mediastinal contours are within normal limits.  Atherosclerosis of the thoracic aorta.  No pneumothorax.  Visualized bony thorax is grossly intact. Numerous surgical clips project over the right breast, suggesting prior lumpectomy.  IMPRESSION: 1.  Low lung volumes without radiographic evidence of acute cardiopulmonary  disease. 2.  Atherosclerosis.   Original Report Authenticated By: Trudie Reed, M.D.    Dg Pelvis 1-2 Views  08/22/2012  *RADIOLOGY REPORT*  Clinical Data: History of trauma from a fall.  PELVIS - 1-2 VIEW  Comparison: No priors.  Findings: AP view of the pelvis demonstrates no acute displaced fracture of the bony pelvic ring.  Bilateral proximal femurs as visualized appear intact.  Femoral heads appear to project over the acetabula bilaterally suggesting that they are properly located. Joint space narrowing, subchondral sclerosis, subchondral cyst formation and osteophyte formation is noted in the hip joints bilaterally (right greater than left), with osteoarthritis.  IMPRESSION: 1.  No acute radiographic abnormality of the bony pelvis. 2.  Mild bilateral hip joint osteoarthritis (right greater than left).   Original Report Authenticated By: Trudie Reed, M.D.    Ct Head Wo Contrast  08/22/2012  *RADIOLOGY REPORT*  Clinical Data:  72 year old female with fall, head and neck injury with loss of consciousness, headache and neck pain.  History of breast cancer.  CT HEAD WITHOUT CONTRAST CT CERVICAL SPINE WITHOUT CONTRAST  Technique:  Multidetector CT imaging of the head and cervical spine was performed following the standard protocol without intravenous contrast.  Multiplanar CT image reconstructions of the cervical spine were also generated.  Comparison:  04/11/2011 head CT  CT HEAD  Findings: Mild chronic small vessel white matter ischemic changes are identified. No acute intracranial abnormalities are identified, including mass lesion or mass effect, hydrocephalus, extra-axial fluid collection, midline shift, hemorrhage, or acute infarction.  The visualized bony calvarium is unremarkable. Right scalp soft tissue swelling is noted.  IMPRESSION: No evidence of acute intracranial abnormality.  Right scalp soft tissue swelling without fracture.  CT CERVICAL SPINE  Findings: Normal alignment is noted. There  is no evidence of acute fracture, subluxation or prevertebral soft tissue swelling. Multilevel degenerative disc disease and spondylosis identified, greatest at C4-C5 and C5-C6.  Mild central spinal narrowing throughout the cervical spine is identified. A moderate central disc protrusion at C3-C4 contacts the cord. No other soft tissue abnormalities are identified. No focal bony lesions are present.  IMPRESSION: No static evidence of acute injury to the cervical spine.  Moderate central disc protrusion at C3-C4 contacting the cord.  Multilevel degenerative changes.   Original Report Authenticated By: Harmon Pier,  M.D.    Ct Cervical Spine Wo Contrast  08/22/2012  *RADIOLOGY REPORT*  Clinical Data:  72 year old female with fall, head and neck injury with loss of consciousness, headache and neck pain.  History of breast cancer.  CT HEAD WITHOUT CONTRAST CT CERVICAL SPINE WITHOUT CONTRAST  Technique:  Multidetector CT imaging of the head and cervical spine was performed following the standard protocol without intravenous contrast.  Multiplanar CT image reconstructions of the cervical spine were also generated.  Comparison:  04/11/2011 head CT  CT HEAD  Findings: Mild chronic small vessel white matter ischemic changes are identified. No acute intracranial abnormalities are identified, including mass lesion or mass effect, hydrocephalus, extra-axial fluid collection, midline shift, hemorrhage, or acute infarction.  The visualized bony calvarium is unremarkable. Right scalp soft tissue swelling is noted.  IMPRESSION: No evidence of acute intracranial abnormality.  Right scalp soft tissue swelling without fracture.  CT CERVICAL SPINE  Findings: Normal alignment is noted. There is no evidence of acute fracture, subluxation or prevertebral soft tissue swelling. Multilevel degenerative disc disease and spondylosis identified, greatest at C4-C5 and C5-C6.  Mild central spinal narrowing throughout the cervical spine is  identified. A moderate central disc protrusion at C3-C4 contacts the cord. No other soft tissue abnormalities are identified. No focal bony lesions are present.  IMPRESSION: No static evidence of acute injury to the cervical spine.  Moderate central disc protrusion at C3-C4 contacting the cord.  Multilevel degenerative changes.   Original Report Authenticated By: Harmon Pier, M.D.    Mr Brain Wo Contrast  08/23/2012  *RADIOLOGY REPORT*  Clinical Data: Syncope.  Breast cancer history.  Hypertension.  MRI HEAD WITHOUT CONTRAST  Technique:  Multiplanar, multiecho pulse sequences of the brain and surrounding structures were obtained according to standard protocol without intravenous contrast.  Comparison: CT head 08/22/2012  Findings: Negative for acute infarct.  Mild chronic microvascular ischemic change in the white matter.  Basal ganglia and brainstem are normal.  Negative for hemorrhage or fluid collection.  No shift to the midline structures.  Negative for mass lesion.  Mild mucosal edema in the paranasal sinuses.  IMPRESSION: Mild chronic microvascular ischemic change in the white matter.  No acute abnormality.   Original Report Authenticated By: Janeece Riggers, M.D.     Microbiology: Recent Results (from the past 240 hour(s))  URINE CULTURE     Status: None   Collection Time    08/22/12  3:41 PM      Result Value Range Status   Specimen Description URINE, RANDOM   Final   Special Requests NONE   Final   Culture  Setup Time 08/23/2012 17:43   Final   Colony Count 45,000 COLONIES/ML   Final   Culture ESCHERICHIA COLI   Final   Report Status 08/25/2012 FINAL   Final   Organism ID, Bacteria ESCHERICHIA COLI   Final     Labs: Basic Metabolic Panel:  Recent Labs Lab 08/22/12 1354 08/23/12 0227 08/23/12 0826 08/24/12 0619 08/25/12 0602  NA 140 140  --  139 139  K 3.1* 3.1*  --  3.5 3.6  CL 106 108  --  110 110  CO2 26 24  --  25 23  GLUCOSE 93 81  --  109* 96  BUN 12 11  --  12 12   CREATININE 1.51* 1.34*  --  1.45* 1.47*  CALCIUM 10.3 9.7  --  9.9 9.7  MG  --   --  1.7  --   --  CBC:  Recent Labs Lab 08/22/12 1354 08/23/12 0227 08/24/12 0619  WBC 2.5* 2.5* 2.2*  NEUTROABS 1.6*  --   --   HGB 9.2* 8.1* 8.4*  HCT 26.2* 23.7* 24.6*  MCV 92.9 94.4 95.0  PLT 73* 68* 66*   Cardiac Enzymes:  Recent Labs Lab 08/22/12 1515 08/22/12 2122 08/23/12 0240 08/23/12 0830  TROPONINI 1.08* 1.21* 0.76* 0.74*   BNP: BNP (last 3 results)  Recent Labs  08/22/12 2122  PROBNP 2395.0*     Signed:  Jai Bear  [facility] 08/25/2012, 2:28 PM

## 2012-08-25 NOTE — Progress Notes (Signed)
Physical Therapy Treatment Patient Details Name: Kristen Wheeler MRN: [id number] DOB: October 31, 1940 Today's Date: 08/25/2012 Time: 7846-9629 PT Time Calculation (min): 23 min  PT Assessment / Plan / Recommendation Comments on Treatment Session  Pt pleasant & agreeable to participate in therapy.   Focus of session was incorporating higher level balance activities today.  Pt did fairly well but cont to recommend 24 hr (A)/(S) + HHPT at d/c.      Follow Up Recommendations  Home health PT;Supervision/Assistance - 24 hour     Does the patient have the potential to tolerate intense rehabilitation     Barriers to Discharge        Equipment Recommendations  None recommended by PT    Recommendations for Other Services    Frequency Min 3X/week   Plan Discharge plan remains appropriate    Precautions / Restrictions Precautions Precautions: Fall Restrictions Weight Bearing Restrictions: No       Mobility  Bed Mobility Bed Mobility: Not assessed Details for Bed Mobility Assistance: Pt sitting on EOB upon arrival.   Transfers Transfers: Sit to Stand;Stand to Sit Sit to Stand: 4: Min guard;With upper extremity assist;From bed Stand to Sit: 4: Min guard;With upper extremity assist;With armrests;To chair/3-in-1 Details for Transfer Assistance: Guarding for safety.  Increased time to stand "it just takes me a minute".   Ambulation/Gait Ambulation/Gait Assistance: 4: Min guard Ambulation Distance (Feet): 250 Feet Assistive device: None Ambulation/Gait Assistance Details: Mild unsteadiness noted with challenges such as head turns & decreased gait speed.   Gait Pattern: Step-through pattern;Wide base of support General Gait Details: Performed gait challenges such as head turns in all directions, gait speed changes, directional changes, & sudden stops.   Stairs: No      PT Goals Acute Rehab PT Goals Time For Goal Achievement: 09/06/12 Potential to Achieve Goals: Good Pt will go Sit to  Stand: with modified independence PT Goal: Sit to Stand - Progress: Progressing toward goal Pt will go Stand to Sit: with modified independence PT Goal: Stand to Sit - Progress: Progressing toward goal Pt will Ambulate: >150 feet;with modified independence;with supervision PT Goal: Ambulate - Progress: Progressing toward goal Additional Goals Additional Goal #1: pt to perform >45/56 on Berg  Visit Information  Last PT Received On: 08/25/12 Assistance Needed: +1    Subjective Data  Patient Stated Goal: return home   Cognition  Cognition Arousal/Alertness: Awake/alert Behavior During Therapy: WFL for tasks assessed/performed Overall Cognitive Status: History of cognitive impairments - at baseline    Balance  Static Standing Balance Static Standing - Balance Support: No upper extremity supported;Bilateral upper extremity supported Static Standing - Level of Assistance: 4: Min assist;5: Stand by assistance Static Standing - Comment/# of Minutes: Min (A) for SLS, & maintaining tandem stance.  Min Guard for standing with feet together/apart, eyes open/closed.   Dynamic Standing Balance Dynamic Standing - Balance Support: No upper extremity supported Dynamic Standing - Level of Assistance: 5: Stand by assistance Dynamic Standing - Balance Activities: Lateral lean/weight shifting;Forward lean/weight shifting;Reaching across midline (picking object up off floor, 360 turns)  End of Session PT - End of Session Equipment Utilized During Treatment: Gait belt Activity Tolerance: Patient tolerated treatment well Patient left: in chair;with call bell/phone within reach Nurse Communication: Mobility status     Verdell Face, [place] [phone] 08/25/2012

## 2012-08-25 NOTE — Care Management Note (Signed)
    Page 1 of 2   08/25/2012     11:06:35 AM   CARE MANAGEMENT NOTE 08/25/2012  Patient:  Kristen Wheeler, Kristen Wheeler   Account Number:  [id number]  Date Initiated:  08/25/2012  Documentation initiated by:  GRAVES-BIGELOW,Tamia Dial  Subjective/Objective Assessment:   Pt admitted with syncope and falls. Pt is from home with husband and son. Pt wants to return home with [facility] services.     Action/Plan:   CM did speak to pt and she is agreeable to HHRN/Pt services with [facility]. CM will make referral for services and SOC to begin within 24-48 hours post d/c.   Anticipated DC Date:  08/25/2012   Anticipated DC Plan:  HOME W HOME HEALTH SERVICES      DC Planning Services  CM consult      [facility] Choice  HOME HEALTH   Choice offered to / List presented to:  C-1 Patient        HH arranged  HH-1 RN  HH-10 DISEASE MANAGEMENT  HH-2 PT      HH agency  [facility]   Status of service:  Completed, signed off Medicare Important Message given?   (If response is "NO", the following Medicare IM given date fields will be blank) Date Medicare IM given:   Date Additional Medicare IM given:    Discharge Disposition:  HOME W HOME HEALTH SERVICES  Per UR Regulation:  Reviewed for med. necessity/level of care/duration of stay  If discussed at Long Length of Stay Meetings, dates discussed:    Comments:  08-25-12 1058 Tomi Bamberger, RN,BSN [phone] Pt stated she will make her family aware of services that she has agreed upon. No further needs from CM at this time.

## 2012-08-27 ENCOUNTER — Telehealth: Payer: Self-pay | Admitting: Family Medicine

## 2012-08-27 ENCOUNTER — Other Ambulatory Visit: Payer: Self-pay | Admitting: Family Medicine

## 2012-08-27 MED ORDER — LORAZEPAM 0.5 MG PO TABS: 0.5000 mg | ORAL_TABLET | Freq: Every evening | ORAL | Status: DC | PRN

## 2012-08-27 NOTE — Telephone Encounter (Signed)
Ativan 0.5 one at bedtime if needed #7 no refill

## 2012-08-27 NOTE — Telephone Encounter (Signed)
[facility] notified that the ativan had been called in and requested for them to give [place] a call next week regarding status of pt  Daughter in law [place] notified and aware rx called

## 2012-09-11 NOTE — Telephone Encounter (Signed)
Dr. Modesto Charon did you talk with her already?

## 2012-09-12 NOTE — Telephone Encounter (Signed)
Spoke with the daughter-in-law. About Mother-in-law recent hospitalization.  Cardiac meds changed because it made her heart slow and she fainted. However , patient was recently given ativan and prozac as well. I tried to explain to daughter-in-law that since patient has gotten worse with her dementia nd become combative , it is time for placement. However patient's husband and family wants to keep her home and wants an agency to come in during the day every day to take care of her. Advised that the burden still falls on the family and it is tremendous and patient has come to the point of placement. Polypharmacy increases her risk of mor morbidity and mortality.  She is to make an appointment for follow up to determine needs.  FW

## 2012-09-14 ENCOUNTER — Ambulatory Visit: Payer: Self-pay | Admitting: Family Medicine

## 2012-09-22 ENCOUNTER — Ambulatory Visit: Payer: Self-pay | Admitting: Family Medicine

## 2012-09-22 ENCOUNTER — Telehealth: Payer: Self-pay | Admitting: Family Medicine

## 2012-09-23 NOTE — Telephone Encounter (Signed)
Spoke with daughter-in-law. Patient's dementia worsening. Not bathing, refuses to leave her bed. Didn't come to appt yesterday. Rest of family refuses to take care of her and leaves it to the daughter-in-law. Family won't help and family refuses to put her in the nursing home. Not much else I can do. Family dysfunctional and patient needs re-evaluation and patient needs placement. Family needs to cooperate in her care. Patient has home health involvement. FW

## 2012-09-26 DIAGNOSIS — R55 Syncope and collapse: Secondary | ICD-10-CM

## 2012-09-26 DIAGNOSIS — M6281 Muscle weakness (generalized): Secondary | ICD-10-CM

## 2012-09-26 DIAGNOSIS — R269 Unspecified abnormalities of gait and mobility: Secondary | ICD-10-CM

## 2012-10-08 ENCOUNTER — Telehealth: Payer: Self-pay | Admitting: Family Medicine

## 2012-10-08 NOTE — Telephone Encounter (Signed)
Kristen Wheeler i am not sure if you would like for them to make an appointment or not

## 2012-10-08 NOTE — Telephone Encounter (Signed)
Left message for pt to return call   She will need appt

## 2012-10-12 ENCOUNTER — Telehealth: Payer: Self-pay | Admitting: Family Medicine

## 2012-10-12 NOTE — Telephone Encounter (Signed)
Left message for pt to return call.

## 2012-10-12 NOTE — Telephone Encounter (Signed)
Home health has been involved since her discharge from the hospital aren't they still involved in her care? And can just send the paper work for me to sign.?  Valena Ivanov P. Modesto Charon, M.D.

## 2012-10-14 NOTE — Telephone Encounter (Signed)
Trying to get sister on hippa,

## 2012-10-16 ENCOUNTER — Telehealth: Payer: Self-pay | Admitting: Family Medicine

## 2012-10-16 NOTE — Telephone Encounter (Signed)
[name] notified and is aware mother in law has appt on Monday

## 2012-10-17 ENCOUNTER — Other Ambulatory Visit: Payer: Self-pay | Admitting: Family Medicine

## 2012-10-19 ENCOUNTER — Other Ambulatory Visit: Payer: Self-pay | Admitting: *Deleted

## 2012-10-19 ENCOUNTER — Ambulatory Visit (INDEPENDENT_AMBULATORY_CARE_PROVIDER_SITE_OTHER): Payer: Medicare Other | Admitting: General Practice

## 2012-10-19 ENCOUNTER — Ambulatory Visit: Payer: Self-pay | Admitting: Nurse Practitioner

## 2012-10-19 VITALS — BP 149/89 | HR 111 | Temp 98.8°F | Ht 64.0 in | Wt 151.0 lb

## 2012-10-19 DIAGNOSIS — M25473 Effusion, unspecified ankle: Secondary | ICD-10-CM

## 2012-10-19 DIAGNOSIS — Z79899 Other long term (current) drug therapy: Secondary | ICD-10-CM

## 2012-10-19 DIAGNOSIS — Z742 Need for assistance at home and no other household member able to render care: Secondary | ICD-10-CM

## 2012-10-19 DIAGNOSIS — R609 Edema, unspecified: Secondary | ICD-10-CM

## 2012-10-19 MED ORDER — FENOFIBRATE 160 MG PO TABS: 160.0000 mg | ORAL_TABLET | Freq: Every day | ORAL | Status: DC

## 2012-10-19 MED ORDER — MEMANTINE HCL 5 MG PO TABS: 5.0000 mg | ORAL_TABLET | Freq: Every day | ORAL | Status: DC

## 2012-10-19 MED ORDER — CYANOCOBALAMIN 1000 MCG PO TABS: 1000.0000 ug | ORAL_TABLET | Freq: Every day | ORAL | Status: DC

## 2012-10-19 NOTE — Progress Notes (Signed)
Subjective:    Patient ID: Kristen Wheeler, female    DOB: January 14, 1941, 72 y.o.   MRN: [id number]  HPI Patient presents today for evaluation of home health assistance. She has family members with her. Son [name]), husband [name]), and sister [name]). She reports taking medications as prescribed, but family reports she is non compliant, forgetful and throws medications at times. Patient's husband reports he attempts to give patient her medications. Reports she is often confused and doesn't understand why she needs to take medications.  Patient and family reports she is unable to cook meals. There are concerns from family that patient isn't receiving proper nutrition because patient husband has limited ability in preparing food, due to his physical disabilities.  Reports patient has fallen three times in the past year, once requiring emergency medical attention, due to facial injury. Reports patient has an unsteady gait also.  Family reports patient may take a bath once every 8 days, because of limited assistance from a female. Patient reports having two sons.  Patient's family has concerns about home much longer patient will be able to remain at home and would like questions answered by a Child psychotherapist.     Review of Systems  Constitutional: Positive for appetite change. Negative for fever and chills.       Poor appetite at times  HENT: Negative for ear pain, neck pain and neck stiffness.   Respiratory: Negative for cough, chest tightness and shortness of breath.   Cardiovascular: Negative for chest pain and palpitations.       Ankle swelling  Gastrointestinal: Negative for nausea, vomiting, abdominal pain and blood in stool.  Genitourinary: Negative for dysuria, flank pain and difficulty urinating.  Musculoskeletal: Positive for gait problem. Negative for back pain.       Unsteady gait   Neurological: Positive for weakness. Negative for dizziness, numbness and headaches.       Generalized  weakness  Psychiatric/Behavioral: Positive for agitation.       Family reports agitated at times when she is confused or forgetful       Objective:   Physical Exam  Constitutional: She is oriented to person, place, and time. She appears well-developed and well-nourished.  HENT:  Head: Normocephalic and atraumatic.  Right Ear: External ear normal.  Left Ear: External ear normal.  Cardiovascular: Regular rhythm and normal heart sounds.  Tachycardia present.   Pulses:      Dorsalis pedis pulses are 1+ on the right side, and 1+ on the left side.  Pulmonary/Chest: Effort normal and breath sounds normal. No respiratory distress. She exhibits no tenderness.  Abdominal: Soft. Bowel sounds are normal. She exhibits no distension. There is no tenderness.  Musculoskeletal:  Generalized weakness noted. Patient required assistance with standing and becoming steady with first few steps.   Neurological: She is alert and oriented to person, place, and time.  Patient was alert and oriented, but forgetful about previous answers and poor historian.   Skin: Skin is warm and dry.  Psychiatric: She has a normal mood and affect. Her speech is normal. She is agitated.  Slightly agitated when questions asked about activities of daily living, medications, etc. Patient's agitation resolved after questions once questions completed.           Assessment & Plan:  1. Need for home health care -Patient demonstrates need for home health assistance with meals (preparation) for adequate nutrition -Patient also will need assistance with personal hygiene 2-3 times weekly (bathing) -Patient is at risk  for falls, so she will need a physical therapy evaluation and treatment plan   2. Medication management -Medication management is needed to ensure patient is taking her medications as prescribed  3. Ankle edema -Take medications as prescribed -elevate feet while sitting -RTO if symptoms worsen or seek emergency  medical treatment is shortness of breath develops -Patient's family members verbalized understanding -Coralie Keens, FNP-C

## 2012-10-21 ENCOUNTER — Other Ambulatory Visit: Payer: Self-pay | Admitting: General Practice

## 2012-10-21 DIAGNOSIS — Z742 Need for assistance at home and no other household member able to render care: Secondary | ICD-10-CM

## 2012-10-21 NOTE — Progress Notes (Signed)
Referral made to [facility] at 3:30p

## 2012-10-26 ENCOUNTER — Telehealth: Payer: Self-pay | Admitting: Nurse Practitioner

## 2012-10-26 NOTE — Telephone Encounter (Signed)
Mae to address 

## 2012-10-28 ENCOUNTER — Telehealth: Payer: Self-pay | Admitting: Nurse Practitioner

## 2012-10-28 MED ORDER — LORAZEPAM 0.5 MG PO TABS: 0.5000 mg | ORAL_TABLET | Freq: Every evening | ORAL | Status: DC | PRN

## 2012-10-28 NOTE — Telephone Encounter (Signed)
Please call in ativan rx 0 refills

## 2012-10-29 ENCOUNTER — Other Ambulatory Visit: Payer: Self-pay | Admitting: General Practice

## 2012-10-29 NOTE — Telephone Encounter (Signed)
Per mae and mmm - ok to rf the ativan rx- [facility] and 0 [id number]

## 2012-10-29 NOTE — Telephone Encounter (Signed)
Please advise 

## 2012-11-04 ENCOUNTER — Telehealth: Payer: Self-pay | Admitting: *Deleted

## 2012-11-04 NOTE — Telephone Encounter (Signed)
She is going to work with her on her balance but believes that occupational therapy along with home health aid would be beneficial.  The patient and family are having a hard time with ADLs.  She is going to fax over an order for Dr. Modesto Charon to sign.

## 2012-11-05 ENCOUNTER — Telehealth: Payer: Self-pay | Admitting: *Deleted

## 2012-11-05 NOTE — Telephone Encounter (Signed)
Ativan was called in last week, but seems to be making agitation worse. Could we try some haldol? If so Have Tollie Pizza call into [facility] at [employer] [phone]

## 2012-11-09 ENCOUNTER — Telehealth: Payer: Self-pay | Admitting: Family Medicine

## 2012-11-09 ENCOUNTER — Other Ambulatory Visit: Payer: Self-pay | Admitting: Family Medicine

## 2012-11-09 DIAGNOSIS — F0391 Unspecified dementia with behavioral disturbance: Secondary | ICD-10-CM

## 2012-11-09 DIAGNOSIS — F03918 Unspecified dementia, unspecified severity, with other behavioral disturbance: Secondary | ICD-10-CM

## 2012-11-09 MED ORDER — CITALOPRAM HYDROBROMIDE 10 MG PO TABS: 10.0000 mg | ORAL_TABLET | Freq: Every day | ORAL | Status: DC

## 2012-11-09 NOTE — Telephone Encounter (Signed)
Spoke with heather and info below given.

## 2012-11-09 NOTE — Telephone Encounter (Signed)
Spoke with Kristen Wheeler earlier and have attempted to call every number in this chart. Spouse home number disconnected . Mail box full on kevin and jacqueline no voice mail.  Heather notified of unsucess of getting in touch with familly about new med

## 2012-11-09 NOTE — Telephone Encounter (Signed)
These medications unfortunately do not work well with the elderly with dementia. And has a lot of side effects and can do more harm. We have used citalopram 20 mg daily to see if it will help and she may need placement and consult with the [facility] center evaluation for advise.

## 2012-11-10 NOTE — Telephone Encounter (Signed)
Spoke with jacqueline and she is aware that med called into [facility].

## 2012-11-13 ENCOUNTER — Encounter: Payer: Self-pay | Admitting: Family Medicine

## 2012-11-13 ENCOUNTER — Ambulatory Visit (INDEPENDENT_AMBULATORY_CARE_PROVIDER_SITE_OTHER): Payer: Medicare Other | Admitting: Family Medicine

## 2012-11-13 VITALS — BP 148/78 | HR 106 | Temp 97.8°F | Wt 149.2 lb

## 2012-11-13 DIAGNOSIS — E785 Hyperlipidemia, unspecified: Secondary | ICD-10-CM

## 2012-11-13 DIAGNOSIS — F03918 Unspecified dementia, unspecified severity, with other behavioral disturbance: Secondary | ICD-10-CM

## 2012-11-13 DIAGNOSIS — E039 Hypothyroidism, unspecified: Secondary | ICD-10-CM | POA: Insufficient documentation

## 2012-11-13 DIAGNOSIS — F0391 Unspecified dementia with behavioral disturbance: Secondary | ICD-10-CM

## 2012-11-13 DIAGNOSIS — I10 Essential (primary) hypertension: Secondary | ICD-10-CM

## 2012-11-13 MED ORDER — MEMANTINE HCL 10 MG PO TABS: 10.0000 mg | ORAL_TABLET | Freq: Every day | ORAL | Status: AC

## 2012-11-13 NOTE — Progress Notes (Signed)
Patient ID: Kristen Wheeler, female   DOB: 21-Mar-1941, 72 y.o.   MRN: [id number] SUBJECTIVE: CC: Chief Complaint  Patient presents with  . Follow-up    4 month follow up family states constipaiton   HPI: 1)Dementia: worse, the family is thinking of placement but her husband of 50 years need to make a decision.she is getting worse.  2) diabetes: stable  3)hypothyroidism.  H/o UTI  Past Medical History  Diagnosis Date  . Dementia   . Thyroid disease   . Hyperlipidemia   . Breast cancer   . Hypertension     primary Dr. Modesto Charon, saw last Oct 2012,   Past Surgical History  Procedure Laterality Date  . Appendectomy    . Breast surgery     History   Social History  . Marital Status: Married    Spouse Name: N/A    Number of Children: N/A  . Years of Education: N/A   Occupational History  . Not on file.   Social History Main Topics  . Smoking status: Never Smoker   . Smokeless tobacco: Not on file  . Alcohol Use: No  . Drug Use: No  . Sexually Active:    Other Topics Concern  . Not on file   Social History Narrative  . No narrative on file   Family History  Problem Relation Age of Onset  . Diabetes Mother   . Coronary artery disease Mother   . Coronary artery disease Father    Current Outpatient Prescriptions on File Prior to Visit  Medication Sig Dispense Refill  . amLODipine (NORVASC) 10 MG tablet Take 10 mg by mouth daily.      Marland Kitchen aspirin 81 MG tablet Take 81 mg by mouth at bedtime.      Marland Kitchen atorvastatin (LIPITOR) 20 MG tablet Take 20 mg by mouth at bedtime.       . Cholecalciferol (VITAMIN D PO) Take 1 tablet by mouth daily.      . fenofibrate 160 MG tablet Take 1 tablet (160 mg total) by mouth daily.  30 tablet  2  . levothyroxine (SYNTHROID, LEVOTHROID) 75 MCG tablet Take 75 mcg by mouth daily before breakfast.      . MEGACE ES 625 MG/5ML suspension TAKE 5 ML BY MOUTH ONCE A DAY  150 mL  2  . cyanocobalamin 1000 MCG tablet Take 1 tablet (1,000 mcg total)  by mouth daily.  30 tablet  2  . feeding supplement (ENSURE IMMUNE HEALTH) LIQD Take 237 mLs by mouth 3 (three) times daily with meals.  12 Bottle  1  . LORazepam (ATIVAN) 0.5 MG tablet Take 1 tablet (0.5 mg total) by mouth at bedtime as needed for anxiety.  30 tablet  0   No current facility-administered medications on file prior to visit.   Allergies  Allergen Reactions  . Penicillins     Advised was allergic to penicillin early in life.    There is no immunization history on file for this patient. Prior to Admission medications   Medication Sig Start Date End Date Taking? Authorizing Provider  amLODipine (NORVASC) 10 MG tablet Take 10 mg by mouth daily.   Yes Historical Provider, MD  aspirin 81 MG tablet Take 81 mg by mouth at bedtime.   Yes Historical Provider, MD  atorvastatin (LIPITOR) 20 MG tablet Take 20 mg by mouth at bedtime.    Yes Historical Provider, MD  Cholecalciferol (VITAMIN D PO) Take 1 tablet by mouth daily.   Yes  Historical Provider, MD  fenofibrate 160 MG tablet Take 1 tablet (160 mg total) by mouth daily. 10/19/12  Yes Ileana Ladd, MD  levothyroxine (SYNTHROID, LEVOTHROID) 75 MCG tablet Take 75 mcg by mouth daily before breakfast.   Yes Historical Provider, MD  MEGACE ES 625 MG/5ML suspension TAKE 5 ML BY MOUTH ONCE A DAY 08/27/12  Yes Ileana Ladd, MD  memantine (NAMENDA) 10 MG tablet Take 1 tablet (10 mg total) by mouth daily. 11/13/12  Yes Ileana Ladd, MD  cyanocobalamin 1000 MCG tablet Take 1 tablet (1,000 mcg total) by mouth daily. 10/19/12   Ileana Ladd, MD  feeding supplement [facility] IMMUNE HEALTH) LIQD Take 237 mLs by mouth 3 (three) times daily with meals. 04/21/11   Belkys A Regalado, MD  LORazepam (ATIVAN) 0.5 MG tablet Take 1 tablet (0.5 mg total) by mouth at bedtime as needed for anxiety. 10/28/12 10/28/13  Mary-Margaret Daphine Deutscher, FNP     ROS: As above in the HPI. All other systems are stable or negative.  OBJECTIVE: APPEARANCE:  Patient in no  acute distress.The patient appeared well nourished and normally developed. Acyanotic. Waist: VITAL SIGNS:BP 148/78  Pulse 106  Temp(Src) 97.8 F (36.6 C) (Oral)  Wt 149 lb 3.2 oz (67.677 kg)  BMI 25.6 kg/m2 WF flat facies  SKIN: warm and  Dry without overt rashes, tattoos and scars  HEAD and Neck: without JVD, Head and scalp: normal Eyes:No scleral icterus. Fundi normal, eye movements normal. Ears: Auricle normal, canal normal, Tympanic membranes normal, insufflation normal. Nose: normal Throat: normal Neck & thyroid: normal  CHEST & LUNGS: Chest wall: normal Lungs: Clear  CVS: Reveals the PMI to be normally located. Regular rhythm, First and Second Heart sounds are normal,  absence of murmurs, rubs or gallops. Peripheral vasculature: Radial pulses: normal  ABDOMEN:  Appearance: normal Benign, no organomegaly, no masses, no Abdominal Aortic enlargement. No Guarding , no rebound. No Bruits. Bowel sounds: normal  RECTAL: N/A GU: N/A  EXTREMETIES: nonedematous.  NEUROLOGIC: oriented to person only; nonfocal in extremities  ASSESSMENT: Dementia with behavioral disturbance - Plan: memantine (NAMENDA) 10 MG tablet, POCT urinalysis dipstick, POCT UA - Microscopic Only, Urine culture, COMPLETE METABOLIC PANEL WITH GFR  Unspecified hypothyroidism - Plan: TSH  Hypertension  Hyperlipidemia  PLAN: Orders Placed This Encounter  Procedures  . Urine culture  . COMPLETE METABOLIC PANEL WITH GFR  . TSH  . POCT urinalysis dipstick  . POCT UA - Microscopic Only   Meds ordered this encounter  Medications  . memantine (NAMENDA) 10 MG tablet    Sig: Take 1 tablet (10 mg total) by mouth daily.    Dispense:  30 tablet    Refill:  2   Increase the namenda. Expedite placement.  Await labs.  Return in about 4 weeks (around 12/11/2012) for Recheck medical problems.  Parmvir Boomer P. Modesto Charon, M.D.

## 2012-11-14 LAB — COMPLETE METABOLIC PANEL WITH GFR
ALT: 14 U/L (ref 0–35)
AST: 37 U/L (ref 0–37)
Albumin: 3.9 g/dL (ref 3.5–5.2)
Alkaline Phosphatase: 38 U/L — ABNORMAL LOW (ref 39–117)
BUN: 18 mg/dL (ref 6–23)
CO2: 26 mEq/L (ref 19–32)
Calcium: 11.3 mg/dL — ABNORMAL HIGH (ref 8.4–10.5)
Chloride: 111 mEq/L (ref 96–112)
Creat: 1.7 mg/dL — ABNORMAL HIGH (ref 0.50–1.10)
GFR, Est African American: 34 mL/min — ABNORMAL LOW
GFR, Est Non African American: 30 mL/min — ABNORMAL LOW
Glucose, Bld: 97 mg/dL (ref 70–99)
Potassium: 3.2 mEq/L — ABNORMAL LOW (ref 3.5–5.3)
Sodium: 143 mEq/L (ref 135–145)
Total Bilirubin: 1.3 mg/dL — ABNORMAL HIGH (ref 0.3–1.2)
Total Protein: 6.7 g/dL (ref 6.0–8.3)

## 2012-11-14 LAB — TSH: TSH: 2.526 u[IU]/mL (ref 0.350–4.500)

## 2012-11-17 ENCOUNTER — Telehealth: Payer: Self-pay

## 2012-11-17 ENCOUNTER — Other Ambulatory Visit: Payer: Self-pay | Admitting: Family Medicine

## 2012-11-17 DIAGNOSIS — N390 Urinary tract infection, site not specified: Secondary | ICD-10-CM

## 2012-11-17 DIAGNOSIS — E876 Hypokalemia: Secondary | ICD-10-CM

## 2012-11-17 LAB — POCT URINALYSIS DIPSTICK
Bilirubin, UA: NEGATIVE
Glucose, UA: NEGATIVE
Ketones, UA: NEGATIVE
Nitrite, UA: POSITIVE
Spec Grav, UA: 1.02
Urobilinogen, UA: NEGATIVE
pH, UA: 5

## 2012-11-17 LAB — POCT UA - MICROSCOPIC ONLY
Casts, Ur, LPF, POC: NEGATIVE
Crystals, Ur, HPF, POC: NEGATIVE
Yeast, UA: NEGATIVE

## 2012-11-17 MED ORDER — NITROFURANTOIN MONOHYD MACRO 100 MG PO CAPS: 100.0000 mg | ORAL_CAPSULE | Freq: Two times a day (BID) | ORAL | Status: DC

## 2012-11-17 NOTE — Telephone Encounter (Signed)
Kristen Wheeler  (Daughter in Social worker)  wants to know what she can take for constipation .

## 2012-11-17 NOTE — Progress Notes (Signed)
Quick Note:  Labs abnormal. UTI: will need to Rx an antibiotic for this. Nitrofurantoin. Done in [facility]. Potassium is low. Need to eat 1 banana a day and recheck in 2 weeks. Calcium is very high . Need to recheck in 2 weeks. Labs ordered in EPIC. ______

## 2012-11-18 NOTE — Telephone Encounter (Signed)
Spoke with [name]. Prune juice/fiber Then miralax daily until BM starts then use as prn. Nakhia Levitan P. Modesto Charon, M.D.

## 2012-11-20 LAB — URINE CULTURE: Colony Count: >=100000

## 2012-11-23 ENCOUNTER — Telehealth: Payer: Self-pay

## 2012-11-23 ENCOUNTER — Emergency Department (HOSPITAL_COMMUNITY): Payer: Medicare Other

## 2012-11-23 ENCOUNTER — Telehealth: Payer: Self-pay | Admitting: Family Medicine

## 2012-11-23 ENCOUNTER — Encounter (HOSPITAL_COMMUNITY): Payer: Self-pay

## 2012-11-23 ENCOUNTER — Observation Stay (HOSPITAL_COMMUNITY)
Admission: EM | Admit: 2012-11-23 | Discharge: 2012-11-24 | Disposition: A | Discharge status: Home / Self Care | Payer: Medicare Other | Attending: Internal Medicine | Admitting: Internal Medicine

## 2012-11-23 DIAGNOSIS — E785 Hyperlipidemia, unspecified: Secondary | ICD-10-CM | POA: Diagnosis present

## 2012-11-23 DIAGNOSIS — N183 Chronic kidney disease, stage 3 unspecified: Secondary | ICD-10-CM | POA: Diagnosis present

## 2012-11-23 DIAGNOSIS — E039 Hypothyroidism, unspecified: Secondary | ICD-10-CM | POA: Diagnosis present

## 2012-11-23 DIAGNOSIS — F0391 Unspecified dementia with behavioral disturbance: Secondary | ICD-10-CM

## 2012-11-23 DIAGNOSIS — I129 Hypertensive chronic kidney disease with stage 1 through stage 4 chronic kidney disease, or unspecified chronic kidney disease: Secondary | ICD-10-CM | POA: Insufficient documentation

## 2012-11-23 DIAGNOSIS — F03918 Unspecified dementia, unspecified severity, with other behavioral disturbance: Principal | ICD-10-CM | POA: Diagnosis present

## 2012-11-23 DIAGNOSIS — I4891 Unspecified atrial fibrillation: Secondary | ICD-10-CM | POA: Diagnosis present

## 2012-11-23 DIAGNOSIS — I10 Essential (primary) hypertension: Secondary | ICD-10-CM | POA: Diagnosis present

## 2012-11-23 DIAGNOSIS — Z23 Encounter for immunization: Secondary | ICD-10-CM | POA: Insufficient documentation

## 2012-11-23 LAB — COMPREHENSIVE METABOLIC PANEL
Alkaline Phosphatase: 40 U/L (ref 39–117)
BUN: 14 mg/dL (ref 6–23)
GFR calc Af Amer: 41 mL/min — ABNORMAL LOW (ref >90)
Glucose, Bld: 88 mg/dL (ref 70–99)
Potassium: 3.4 mEq/L — ABNORMAL LOW (ref 3.5–5.1)
Total Protein: 6.9 g/dL (ref 6.0–8.3)

## 2012-11-23 LAB — URINALYSIS, ROUTINE W REFLEX MICROSCOPIC
Ketones, ur: NEGATIVE mg/dL
Leukocytes, UA: NEGATIVE
Nitrite: NEGATIVE
Protein, ur: NEGATIVE mg/dL
pH: 6.5 (ref 5.0–8.0)

## 2012-11-23 LAB — CBC WITH DIFFERENTIAL/PLATELET
Basophils Relative: 1 % (ref 0–1)
Eosinophils Absolute: 0.1 10*3/uL (ref 0.0–0.7)
Eosinophils Relative: 2 % (ref 0–5)
HCT: 32 % — ABNORMAL LOW (ref 36.0–46.0)
Hemoglobin: 10.5 g/dL — ABNORMAL LOW (ref 12.0–15.0)
MCH: 32.4 pg (ref 26.0–34.0)
MCHC: 32.8 g/dL (ref 30.0–36.0)
MCV: 98.8 fL (ref 78.0–100.0)
Monocytes Absolute: 0.2 10*3/uL (ref 0.1–1.0)
Monocytes Relative: 6 % (ref 3–12)
Neutro Abs: 2.4 10*3/uL (ref 1.7–7.7)

## 2012-11-23 LAB — MRSA PCR SCREENING: MRSA by PCR: NEGATIVE

## 2012-11-23 LAB — PHOSPHORUS: Phosphorus: 1.7 mg/dL — ABNORMAL LOW (ref 2.3–4.6)

## 2012-11-23 MED ORDER — ONDANSETRON HCL 4 MG/2ML IJ SOLN: 4.0000 mg | Freq: Four times a day (QID) | INTRAMUSCULAR | Status: DC | PRN

## 2012-11-23 MED ORDER — ONDANSETRON HCL 4 MG PO TABS: 4.0000 mg | ORAL_TABLET | Freq: Four times a day (QID) | ORAL | Status: DC | PRN

## 2012-11-23 MED ORDER — LEVOTHYROXINE SODIUM 75 MCG PO TABS
75.0000 ug | ORAL_TABLET | Freq: Every day | ORAL | Status: DC
  Administered 2012-11-24: 75 ug via ORAL
  Filled 2012-11-23: qty 1

## 2012-11-23 MED ORDER — MEMANTINE HCL 10 MG PO TABS
10.0000 mg | ORAL_TABLET | Freq: Every day | ORAL | Status: DC
  Administered 2012-11-23 – 2012-11-24 (×2): 10 mg via ORAL
  Filled 2012-11-23 (×2): qty 1

## 2012-11-23 MED ORDER — SODIUM CHLORIDE 0.9 % IJ SOLN: 3.0000 mL | INTRAMUSCULAR | Status: DC | PRN

## 2012-11-23 MED ORDER — SODIUM CHLORIDE 0.9 % IV SOLN: 250.0000 mL | INTRAVENOUS | Status: DC | PRN

## 2012-11-23 MED ORDER — FENOFIBRATE 160 MG PO TABS
160.0000 mg | ORAL_TABLET | Freq: Every day | ORAL | Status: DC
  Administered 2012-11-23 – 2012-11-24 (×2): 160 mg via ORAL
  Filled 2012-11-23 (×3): qty 1

## 2012-11-23 MED ORDER — SODIUM CHLORIDE 0.9 % IV BOLUS (SEPSIS)
1000.0000 mL | Freq: Once | INTRAVENOUS | Status: AC
  Administered 2012-11-23: 1000 mL via INTRAVENOUS

## 2012-11-23 MED ORDER — LORAZEPAM 0.5 MG PO TABS
0.5000 mg | ORAL_TABLET | Freq: Every day | ORAL | Status: DC
  Administered 2012-11-23: 0.5 mg via ORAL
  Filled 2012-11-23: qty 1

## 2012-11-23 MED ORDER — PNEUMOCOCCAL VAC POLYVALENT 25 MCG/0.5ML IJ INJ
0.5000 mL | INJECTION | INTRAMUSCULAR | Status: AC
  Administered 2012-11-24: 0.5 mL via INTRAMUSCULAR
  Filled 2012-11-23 (×2): qty 0.5

## 2012-11-23 MED ORDER — MEGESTROL ACETATE 625 MG/5ML PO SUSP: 625.0000 mg | Freq: Every day | ORAL | Status: DC

## 2012-11-23 MED ORDER — LORAZEPAM 2 MG/ML IJ SOLN: 0.5000 mg | Freq: Four times a day (QID) | INTRAMUSCULAR | Status: DC | PRN

## 2012-11-23 MED ORDER — VITAMIN B-12 1000 MCG PO TABS
1000.0000 ug | ORAL_TABLET | Freq: Every day | ORAL | Status: DC
  Administered 2012-11-23 – 2012-11-24 (×2): 1000 ug via ORAL
  Filled 2012-11-23 (×2): qty 1

## 2012-11-23 MED ORDER — ACETAMINOPHEN 325 MG PO TABS: 650.0000 mg | ORAL_TABLET | Freq: Four times a day (QID) | ORAL | Status: DC | PRN

## 2012-11-23 MED ORDER — ENOXAPARIN SODIUM 30 MG/0.3ML ~~LOC~~ SOLN
30.0000 mg | SUBCUTANEOUS | Status: DC
  Administered 2012-11-23: 30 mg via SUBCUTANEOUS
  Filled 2012-11-23: qty 0.3

## 2012-11-23 MED ORDER — ENSURE COMPLETE PO LIQD
237.0000 mL | Freq: Three times a day (TID) | ORAL | Status: DC
  Administered 2012-11-23 – 2012-11-24 (×2): 237 mL via ORAL

## 2012-11-23 MED ORDER — MEGESTROL ACETATE 400 MG/10ML PO SUSP
400.0000 mg | Freq: Two times a day (BID) | ORAL | Status: DC
  Administered 2012-11-23 – 2012-11-24 (×3): 400 mg via ORAL
  Filled 2012-11-23 (×3): qty 10

## 2012-11-23 MED ORDER — NITROFURANTOIN MONOHYD MACRO 100 MG PO CAPS
100.0000 mg | ORAL_CAPSULE | Freq: Two times a day (BID) | ORAL | Status: DC
  Administered 2012-11-23 – 2012-11-24 (×3): 100 mg via ORAL
  Filled 2012-11-23 (×6): qty 1

## 2012-11-23 MED ORDER — SODIUM CHLORIDE 0.9 % IJ SOLN
3.0000 mL | Freq: Two times a day (BID) | INTRAMUSCULAR | Status: DC
  Administered 2012-11-23 – 2012-11-24 (×3): 3 mL via INTRAVENOUS

## 2012-11-23 MED ORDER — ASPIRIN EC 81 MG PO TBEC
81.0000 mg | DELAYED_RELEASE_TABLET | Freq: Every day | ORAL | Status: DC
  Administered 2012-11-23: 81 mg via ORAL
  Filled 2012-11-23: qty 1

## 2012-11-23 MED ORDER — ATORVASTATIN CALCIUM 20 MG PO TABS
20.0000 mg | ORAL_TABLET | Freq: Every day | ORAL | Status: DC
  Administered 2012-11-23: 20 mg via ORAL
  Filled 2012-11-23: qty 1

## 2012-11-23 MED ORDER — CITALOPRAM HYDROBROMIDE 20 MG PO TABS
10.0000 mg | ORAL_TABLET | Freq: Every day | ORAL | Status: DC
  Administered 2012-11-23 – 2012-11-24 (×2): 10 mg via ORAL
  Filled 2012-11-23: qty 2
  Filled 2012-11-23: qty 1

## 2012-11-23 MED ORDER — ACETAMINOPHEN 650 MG RE SUPP: 650.0000 mg | Freq: Four times a day (QID) | RECTAL | Status: DC | PRN

## 2012-11-23 MED ORDER — AMLODIPINE BESYLATE 5 MG PO TABS
10.0000 mg | ORAL_TABLET | Freq: Every day | ORAL | Status: DC
  Administered 2012-11-23 – 2012-11-24 (×2): 10 mg via ORAL
  Filled 2012-11-23 (×2): qty 2

## 2012-11-23 NOTE — Telephone Encounter (Signed)
Took mother in law to hospital and states they will be discharging her tomorrow and the hospital informed her "they have no reason to keep her in hospital.

## 2012-11-23 NOTE — ED Provider Notes (Addendum)
CSN: [id number]     Arrival date & time 11/23/12  1013 History    This chart was scribed for Hilario Quarry, MD, by Yevette Edwards, ED Scribe. This patient was seen in room APA06/APA06 and the patient's care was started at 10:17 AM.   First MD Initiated Contact with Patient 11/23/12 1014     Chief Complaint  Patient presents with  . Altered Mental Status  . Leg Swelling   LEVEL 5 CAVEAT (Mental Status Change)  The history is provided by the patient and a relative. The history is limited by the condition of the patient. No language interpreter was used.   HPI Comments: Kristen Wheeler is a 72 y.o. female, with a history of dementia, arriving by EMS, who presents to the Emergency Department complaining of AMS. At bedside, the pt reports that she is currently lying in an ambulance. The pt did not know the year when questioned. At bedside, the pt's temperature via rectal thermometer was 99.5 degrees. The pt's son reports that her PCP, Dr.Wong, recommended that the pt be placed in an long-term care facility, and Dr. Modesto Charon encouraged the family to bring the pt to the ED. The son states that the pt has limited ambulation. He reports that she has experienced decreased appetite and is drinking less. He also reports that his mother has had decreased urination and bowel movements.  He  denies that the pt has a h/o of kidney problems. She does have a h/o of UTI, HTN, and breast cancer. The pt is neither a smoker nor a drinker.   Dr. Modesto Charon is PCP.  Past Medical History  Diagnosis Date  . Dementia   . Thyroid disease   . Hyperlipidemia   . Breast cancer   . Hypertension     primary Dr. Modesto Charon, saw last Oct 2012,   Past Surgical History  Procedure Laterality Date  . Appendectomy    . Breast surgery     Family History  Problem Relation Age of Onset  . Diabetes Mother   . Coronary artery disease Mother   . Coronary artery disease Father    History  Substance Use Topics  . Smoking status: Never  Smoker   . Smokeless tobacco: Not on file  . Alcohol Use: No    No OB history provided. Review of Systems  Unable to perform ROS: Mental status change    Allergies  Penicillins  Home Medications   Current Outpatient Rx  Name  Route  Sig  Dispense  Refill  . amLODipine (NORVASC) 10 MG tablet   Oral   Take 10 mg by mouth daily.         Marland Kitchen aspirin 81 MG tablet   Oral   Take 81 mg by mouth at bedtime.         Marland Kitchen atorvastatin (LIPITOR) 20 MG tablet   Oral   Take 20 mg by mouth at bedtime.          . Cholecalciferol (VITAMIN D PO)   Oral   Take 1 tablet by mouth daily.         . cyanocobalamin 1000 MCG tablet   Oral   Take 1 tablet (1,000 mcg total) by mouth daily.   30 tablet   2   . feeding supplement (ENSURE IMMUNE HEALTH) LIQD   Oral   Take 237 mLs by mouth 3 (three) times daily with meals.   12 Bottle   1   . fenofibrate 160 MG  tablet   Oral   Take 1 tablet (160 mg total) by mouth daily.   30 tablet   2   . levothyroxine (SYNTHROID, LEVOTHROID) 75 MCG tablet   Oral   Take 75 mcg by mouth daily before breakfast.         . LORazepam (ATIVAN) 0.5 MG tablet   Oral   Take 1 tablet (0.5 mg total) by mouth at bedtime as needed for anxiety.   30 tablet   0   . MEGACE ES 625 MG/5ML suspension      TAKE 5 ML BY MOUTH ONCE A DAY   150 mL   2   . memantine (NAMENDA) 10 MG tablet   Oral   Take 1 tablet (10 mg total) by mouth daily.   30 tablet   2   . nitrofurantoin, macrocrystal-monohydrate, (MACROBID) 100 MG capsule   Oral   Take 1 capsule (100 mg total) by mouth 2 (two) times daily.   20 capsule   0    Triage Vitals: BP 150/84  Pulse 95  Temp(Src) 99.7 F (37.6 C) (Oral)  Resp 18  Ht 5\' 5"  (1.651 m)  Wt 150 lb (68.04 kg)  BMI 24.96 kg/m2  SpO2 99%  Physical Exam  Nursing note and vitals reviewed. Constitutional: She appears well-developed and well-nourished. No distress.  HENT:  Head: Normocephalic and atraumatic.  Eyes: EOM  are normal.  Neck: Neck supple. No tracheal deviation present.  Cardiovascular: Normal rate.   Pulmonary/Chest: Effort normal. No respiratory distress.  Musculoskeletal: Normal range of motion.  Neurological: She is alert.  Skin: Skin is warm and dry.  No signs of trauma or breakdown on back or legs.      ED Course   Medications  sodium chloride 0.9 % bolus 1,000 mL (1,000 mLs Intravenous New Bag/Given 11/23/12 1146)    DIAGNOSTIC STUDIES: Oxygen Saturation is 99% on room air, normal by my interpretation.    COORDINATION OF CARE:  10:28 AM-Discussed treatment plan with patient, and the patient agreed to the plan.   Procedures (including critical care time)  Labs Reviewed  CBC WITH DIFFERENTIAL - Abnormal; Notable for the following:    WBC 3.6 (*)    RBC 3.24 (*)    Hemoglobin 10.5 (*)    HCT 32.0 (*)    RDW 16.3 (*)    Platelets 74 (*)    All other components within normal limits  COMPREHENSIVE METABOLIC PANEL - Abnormal; Notable for the following:    Potassium 3.4 (*)    Creatinine, Ser 1.44 (*)    Calcium 11.4 (*)    AST 42 (*)    GFR calc non Af Amer 36 (*)    GFR calc Af Amer 41 (*)    All other components within normal limits  URINALYSIS, ROUTINE W REFLEX MICROSCOPIC - Abnormal; Notable for the following:    Hgb urine dipstick TRACE (*)    All other components within normal limits  URINE MICROSCOPIC-ADD ON  OCCULT BLOOD X 1 CARD TO LAB, STOOL   Dg Chest 1 View  11/23/2012   *RADIOLOGY REPORT*  Clinical Data: Weakness  CHEST - 1 VIEW  Comparison: 08/22/2012  Findings: Right upper quadrant clips are noted. Cardiac leads overlie the chest.  Heart size is normal.  Lung volumes are low but clear.  No pleural effusion.  No acute osseous finding.  Tubing obscures detail at the right lung base.  IMPRESSION: No focal acute cardiopulmonary process. If the patient's  symptoms continue, consider PA and lateral chest radiographs obtained at full inspiration when the patient is  clinically able.   Original Report Authenticated By: Christiana Pellant, M.D.   Ct Head Wo Contrast  11/23/2012   *RADIOLOGY REPORT*  Clinical Data: Fall, altered mental status, history of breast cancer  CT HEAD WITHOUT CONTRAST  Technique:  Contiguous axial images were obtained from the base of the skull through the vertex without contrast.  Comparison: 08/22/2012, brain MRI 08/23/2012  Findings: Cortical volume loss noted with proportional ventricular prominence.  Periventricular white matter hypodensity likely indicates small vessel ischemic change.  No acute hemorrhage, acute infarction, or mass lesion is identified.  No midline shift.  Mild ethmoid sinusitis noted.  IMPRESSION: No acute intracranial finding.   Original Report Authenticated By: Christiana Pellant, M.D.   No diagnosis found.  Date: 11/23/2012  Rate: 76  Rhythm: atrial fibrillation  QRS Axis: left  Intervals: normal  ST/T Wave abnormalities: nonspecific ST/T changes  Conduction Disutrbances:left bundle branch block  Narrative Interpretation:   Old EKG Reviewed: unchanged from 4/14  MDM  Discussed with DR. Rito Ehrlich.  Will admit observation for hypercalcemia.  Family voices understanding that this only tonight and will not be able to place patient in nh from hospital.    Hilario Quarry, MD 11/23/12 1253  Hilario Quarry, MD 11/23/12 [id number]

## 2012-11-23 NOTE — Telephone Encounter (Signed)
Dr Modesto Charon called and spoke with [name] regarding mother's hospitalization

## 2012-11-23 NOTE — ED Notes (Signed)
Updated pt family concerning plan of care. Pt family informed pt would be admitted for one night. EDP informed of family concerns and EDP reported would get social work involved in pt case.

## 2012-11-23 NOTE — Telephone Encounter (Signed)
Call from [employer]. Patient: Kristen Wheeler has suddenly forgotten to eat , how to eat and forgotten about how to use bathroom.  They plan to take her to ED at [facility] and had called for an ambulance. Will have my nurse inform ED nurse. Dementia with sudden worsening of mental status. The family has been slow at deciding on placement and getting all their legal paper work in order, POA , Catering manager. Etter Royall P. Modesto Charon, M.D.

## 2012-11-23 NOTE — Telephone Encounter (Signed)
Discussed with daughter. Someone needs to take  Responsibility and visit Nursiing homes and take charge of the placement process. Living in denial won't work to the patient's benefit while her dementia becomes more unmanageable at home.

## 2012-11-23 NOTE — ED Notes (Signed)
POC occult blood obtained with a negative result. EDP aware.

## 2012-11-23 NOTE — ED Notes (Addendum)
Pt able to walk with two staff assist. Pt unsteady but able to take small steps. EDP aware.

## 2012-11-23 NOTE — ED Notes (Signed)
Pt arrived by ems from home, she is unsure why she is at the ed, stated she was "picked up on the side of the road", bil foot/ankle swelling.  Pt able to answer some ?'s, but rambling thoughts at times.

## 2012-11-23 NOTE — H&P (Signed)
[facility] History and Physical  Kristen Wheeler MVH:[id number] DOB: 02/02/1941 DOA: 11/23/2012  Referring physician: Margarita Grizzle, ER physician PCP: Redmond Baseman, MD  Specialists: None  Chief Complaint: Increased confusion  HPI: Kristen Wheeler is a 72 y.o. female  With past oral history of dementia, hypothyroidism and hypertension has been living at home and cared for by her husband. According to the husband and the patient's sister, over the last few months patient states that worsening decline with increasing agitation and aggression at night as well as worsening weakness. She was diagnosed last week with a urinary tract infection treated. Reportedly, the patient's family discussed with the patient's PCP who advised them to come to the emergency room for further evaluation and possible placement. In the emergency room, patient had normal blood work with the exception of a mildly elevated calcium level of 11.4. Her urinalysis was unremarkable. Because of concerns of placement of an acute medical issue, ER physician discussed this case with myself. Explained that I would not go to place the patient directly from the hospital to a facility, but we could observe her overnight and work up her calcium elevation to ensure that that was not contributing to her worsening mentation. Tomorrow, the patient would have to go home. Would however also be able to have social work discussed with the patient's family about placement options from home.   Review of Systems: Patient evaluated after transfer to floor. Doing okay. Complains of feeling very cold. Denies any headaches or vision changes, dysphasia, chest pain, palpitations, shortness of breath, wheezing, coughing, abdominal pain, trouble urinating, constipation or diarrhea or focal extremity numbness or weakness. Not a great historian given her memory issues  Past Medical History  Diagnosis Date  . Dementia   . Thyroid disease   .  Hyperlipidemia   . Hypertension     primary Dr. Modesto Charon, saw last Oct 2012,  . Breast cancer    Past Surgical History  Procedure Laterality Date  . Appendectomy    . Breast surgery     Social History:  reports that she has never smoked. She does not have any smokeless tobacco history on file. She reports that she does not drink alcohol or use illicit drugs. Patient lives at home with her husband. She does ambulate and even tends to wander. Husband is concerned at times that she has unsteady balance  Allergies  Allergen Reactions  . Penicillins     Advised was allergic to penicillin early in life.    Family History  Problem Relation Age of Onset  . Diabetes Mother   . Coronary artery disease Mother   . Coronary artery disease Father     Prior to Admission medications   Medication Sig Start Date End Date Taking? Authorizing Provider  amLODipine (NORVASC) 10 MG tablet Take 10 mg by mouth daily.   Yes Historical Provider, MD  aspirin EC 81 MG tablet Take 81 mg by mouth at bedtime.   Yes Historical Provider, MD  atorvastatin (LIPITOR) 20 MG tablet Take 20 mg by mouth at bedtime.    Yes Historical Provider, MD  Cholecalciferol (VITAMIN D PO) Take 1 tablet by mouth daily.   Yes Historical Provider, MD  citalopram (CELEXA) 10 MG tablet Take 10 mg by mouth daily. 11/09/12  Yes Historical Provider, MD  cyanocobalamin 1000 MCG tablet Take 1 tablet (1,000 mcg total) by mouth daily. 10/19/12  Yes Ileana Ladd, MD  fenofibrate 160 MG tablet Take 1 tablet (160 mg  total) by mouth daily. 10/19/12  Yes Ileana Ladd, MD  levothyroxine (SYNTHROID, LEVOTHROID) 75 MCG tablet Take 75 mcg by mouth daily before breakfast.   Yes Historical Provider, MD  LORazepam (ATIVAN) 0.5 MG tablet Take 1 tablet (0.5 mg total) by mouth at bedtime as needed for anxiety. 10/28/12 10/28/13 Yes Mary-Margaret Daphine Deutscher, FNP  megestrol (MEGACE ES) 625 MG/5ML suspension Take 625 mg by mouth daily.   Yes Historical Provider, MD   memantine (NAMENDA) 10 MG tablet Take 1 tablet (10 mg total) by mouth daily. 11/13/12  Yes Ileana Ladd, MD  nitrofurantoin, macrocrystal-monohydrate, (MACROBID) 100 MG capsule Take 1 capsule (100 mg total) by mouth 2 (two) times daily. 11/17/12  Yes Ileana Ladd, MD  feeding supplement [facility] IMMUNE HEALTH) LIQD Take 237 mLs by mouth 3 (three) times daily with meals. 04/21/11   Alba Cory, MD   Physical Exam: Filed Vitals:   11/23/12 1115 11/23/12 1130 11/23/12 1230 11/23/12 1340  BP:  136/64 148/69   Pulse: 75   102  Temp:      TempSrc:      Resp: 16 16 16 19   Height:      Weight:      SpO2: 99%   100%     General:  Alert and oriented x2, no acute distress  Eyes: Sclera nonicteric, extraocular movements appear intact  ENT:  Normocephalic and atraumatic, mucous membranes are slightly dry  Neck: Supple, no obvious JVD  Cardiovascular: Regular rhythm with occasional ectopic beats, 2/6 holosystolic murmur with sharp early diastolic  Respiratory: Clear to auscultation bilaterally  Abdomen: Soft, obese, nontender, positive bowel sounds  Skin: Small skin tear on right hand, otherwise no lesions  Musculoskeletal: No clubbing cyanosis, trace pitting edema  Psychiatric: Patient with underlying confusion, currently in no acute psychoses  Neurologic: No obvious deficits  Labs on Admission:  Basic Metabolic Panel:  Recent Labs Lab 11/23/12 1051  NA 140  K 3.4*  CL 106  CO2 29  GLUCOSE 88  BUN 14  CREATININE 1.44*  CALCIUM 11.4*   Liver Function Tests:  Recent Labs Lab 11/23/12 1051  AST 42*  ALT 15  ALKPHOS 40  BILITOT 1.2  PROT 6.9  ALBUMIN 3.6   CBC:  Recent Labs Lab 11/23/12 1051  WBC 3.6*  NEUTROABS 2.4  HGB 10.5*  HCT 32.0*  MCV 98.8  PLT 74*    Radiological Exams on Admission: Dg Chest 1 View  11/23/2012   IMPRESSION: No focal acute cardiopulmonary process. If the patient's symptoms continue, consider PA and lateral chest  radiographs obtained at full inspiration when the patient is clinically able.   Original Report Authenticated By: Christiana Pellant, M.D.   Ct Head Wo Contrast  11/23/2012   .  IMPRESSION: No acute intracranial finding.   Original Report Authenticated By: Christiana Pellant, M.D.    EKG: Independently reviewed. A. fib which is new, left bundle branch block looks to be chronic  Assessment/Plan Principal Problem:   Dementia with behavioral disturbance: Patient has been getting more aggressive, especially at night. It sounds like overall her condition in terms of weakness, mentation and behavior has worsened over the last few months. Initially the plan was for placement, however cannot place patient with out any acute immediate hospitalized problem from the hospital. We'll plan to consult social work with him the options the family. Anytime when patient overnight for observation for evaluation of hypercalcemia to see if this is related to her behavior issues. We'll  also ask physical and occupational therapy to assess.  Active Problems:   Hyperlipidemia: Stable. Continue statins.    Hypertension: Monitor blood pressure. Stable.    Unspecified hypothyroidism: TSH checked last week and found to be normal. Continue Synthroid.    CKD (chronic kidney disease) stage 3, GFR 30-59 ml/min: Looks to be at baseline.    Hypercalcemia: In review of patient's records, she had normal calcium levels in last year. They have been moderately elevated since. She's not on any calcium supplementation. It is possible that her worsening behavior may be from hypercalcemia so we'll check ionized calcium level, intact PTH and phosphorus levels. These are normal, would not workup any further.   atrial fibrillation: Rate controlled, patient is not a Coumadin candidate.  CT scan of head is negative. No further workup at this time    Code Status: DO NOT RESUSCITATE  Family Communication: Plan discussed with patient's husband and  her sister at the bedside.  Disposition Plan: Home tomorrow  Time spent: 35 minutes  Hollice Espy [facility] Pager [phone]  If 7PM-7AM, please contact night-coverage [email or web address] Password [facility] 11/23/2012, 3:01 PM

## 2012-11-24 DIAGNOSIS — I4891 Unspecified atrial fibrillation: Secondary | ICD-10-CM

## 2012-11-24 DIAGNOSIS — E039 Hypothyroidism, unspecified: Secondary | ICD-10-CM

## 2012-11-24 LAB — BASIC METABOLIC PANEL
Calcium: 10.4 mg/dL (ref 8.4–10.5)
Creatinine, Ser: 1.46 mg/dL — ABNORMAL HIGH (ref 0.50–1.10)
GFR calc Af Amer: 40 mL/min — ABNORMAL LOW (ref >90)

## 2012-11-24 LAB — PARATHYROID HORMONE, INTACT (NO CA): PTH: 59.2 pg/mL (ref 14.0–72.0)

## 2012-11-24 MED ORDER — TUBERCULIN PPD 5 UNIT/0.1ML ID SOLN
5.0000 [IU] | Freq: Once | INTRADERMAL | Status: DC
  Administered 2012-11-24: 5 [IU] via INTRADERMAL
  Filled 2012-11-24: qty 0.1

## 2012-11-24 MED ORDER — ENOXAPARIN SODIUM 40 MG/0.4ML ~~LOC~~ SOLN: 40.0000 mg | SUBCUTANEOUS | Status: DC

## 2012-11-24 MED ORDER — POTASSIUM CHLORIDE CRYS ER 20 MEQ PO TBCR
40.0000 meq | EXTENDED_RELEASE_TABLET | Freq: Once | ORAL | Status: AC
  Administered 2012-11-24: 40 meq via ORAL
  Filled 2012-11-24: qty 2

## 2012-11-24 MED ORDER — LORAZEPAM 1 MG PO TABS: 1.0000 mg | ORAL_TABLET | Freq: Every evening | ORAL | Status: DC | PRN

## 2012-11-24 NOTE — Clinical Social Work Psychosocial (Signed)
Clinical Social Work Department BRIEF PSYCHOSOCIAL ASSESSMENT 11/24/2012  Patient:  Kristen Wheeler, Kristen Wheeler     Account Number:  [id number]     Admit date:  11/23/2012  Clinical Social Worker:  Nancie Neas  Date/Time:  11/24/2012 11:30 AM  Referred by:  Physician  Date Referred:  11/24/2012 Referred for  ALF Placement   Other Referral:   Interview type:  Family Other interview type:   daughter-in-law Kristen Wheeler    PSYCHOSOCIAL DATA Living Status:  FAMILY Admitted from facility:   Level of care:   Primary support name:  Kristen Wheeler Primary support relationship to patient:  CHILD, ADULT Degree of support available:   supportive    CURRENT CONCERNS Current Concerns  Post-Acute Placement   Other Concerns:    SOCIAL WORK ASSESSMENT / PLAN CSW spoke with pt's daughter-in-law Kristen Wheeler as pt is not oriented. Kristen Wheeler reports pt lives with her husband and a son. She indicates the son is rarely home and not a caregiver. Kristen Wheeler and Kristen Wheeler live about 10 minutes away and try to assist as much as they can. Pt's husband is deaf and unable to provide as much assistance to pt as she is requiring. Pt has been very difficult to manage at home, particularly the last few months where they have seen a rapid progression of dementia. Pt was diagnosed several years ago and was still working at [employer], but was unable to continue working due to forgetfulness. She has had behaviors associated with dementia, including outbursts. Per Kristen Wheeler, they took pt to PCP who suggested they call EMS to bring pt to hospital for placement. She states they were told her insurance would pay for 100 days of placement. Pt was evaluated by PT today and has no rehab potential. PT feels pt would be best suited using wheelchair for safety. CSW educated [name] on skilled needs and SNF vs ALF. She is aware that pt does not have a skilled need at this time. CSW discussed ALF with PT who reports if facility can  manage, this would be appropriate. Kristen Wheeler states they have already talked to [name] and are aware of rates for memory care unit. She requests CSW initiate bed search there as it would be close for pt's husband to visit. CSW spoke with [name] and will complete FL2. She is aware of private pay and that family can apply for Medicaid if appropriate. Kristen Wheeler understands if pt is ready for d/c prior to completed placement, they will have to take pt home and work on from home. She requests CSW speak with pt's husband regarding finances and he is on his way to hospital now. Possible d/c today. CSW requested TB skin test.   Assessment/plan status:  Psychosocial Support/Ongoing Assessment of Needs Other assessment/ plan:   Information/referral to community resources:   ALF list    PATIENT'S/FAMILY'S RESPONSE TO PLAN OF CARE: Pt unable to discuss plan of care due to dementia. Pt's daughter-in-law states they have to do something as pt is unable to continue to be managed at home.       Kristen Wheeler, [place] [phone]

## 2012-11-24 NOTE — Discharge Summary (Addendum)
Physician Discharge Summary  Kristen Wheeler ZOX:[id number] DOB: 12-Nov-1940 DOA: 11/23/2012  PCP: Redmond Baseman, MD  Admit date: 11/23/2012 Discharge date: 11/24/2012  Time spent: 25 minutes  Recommendations for Outpatient Follow-up:  1. Have increased the patient's dose of Ativan to 1 mg each bedtime when necessary given her sundowning and increased aggression 2. Patient followup with her PCP in one week's time to review her vitamin D levels-labs drawn in hospital. 3. Patient advised to stop taking nitrofurantoin given pharmacy recommendation for contraindication due to decreased creatinine clearance.  Discharge Diagnoses:  Principal Problem:   Dementia with behavioral disturbance Active Problems:   Hyperlipidemia   Hypertension   Unspecified hypothyroidism   CKD (chronic kidney disease) stage 3, GFR 30-59 ml/min   Hypercalcemia   A-fib   Discharge Condition: Unchanged  Diet recommendation: Heart healthy  Filed Weights   11/23/12 1013  Weight: 68.04 kg (150 lb)    History of present illness:  On 7/28:Kristen Wheeler is a 72 y.o. female  With past oral history of dementia, hypothyroidism and hypertension has been living at home and cared for by her husband. According to the husband and the patient's sister, over the last few months patient states that worsening decline with increasing agitation and aggression at night as well as worsening weakness. She was diagnosed last week with a urinary tract infection treated. Reportedly, the patient's family discussed with the patient's PCP who advised them to come to the emergency room for further evaluation and possible placement. In the emergency room, patient had normal blood work with the exception of a mildly elevated calcium level of 11.4. Her urinalysis was unremarkable. Because of concerns of placement of an acute medical issue, ER physician discussed this case with myself. Explained that I would not go to place the patient  directly from the hospital to a facility, but we could observe her overnight and work up her calcium elevation to ensure that that was not contributing to her worsening mentation. Tomorrow, the patient would have to go home. Would however also be able to have social work discussed with the patient's family about placement options from home.    Hospital Course:  Hypercalcemia: Patient Ms. calcium level was elevated at 1.54 confirming hypercalcemia, further evidence by low phosphorus at 1.7. Magnesium level incidentally was normal. Intact PTH level came back normal at 59.2. Patient is on vitamin D and a concern certainly could be for vitamin D toxicity. Labs were drawn for a vitamin D 25-hydroxy and vitamin D one, 25 dihydroxy levels. Results of this are pending and may take several days to come back. Nevertheless, had discontinued patient's vitamin D. Her alkaline phosphatase level was minimally elevated, so I am less concerned that this could be from increased bone turnover from underlying malignancy. She will follow up with her PCP in the next one to 2 weeks on those vitamin D levels. See below.  Dementia with behavioral disturbance: Patient has been cared for by her husband at home who was starting to already feel overwhelmed when in the last month, patient's behavior has gotten significantly worse. She is aggressive hostility at nighttime. She also looks to me recently become slightly delirious and would need maximum assistance and prompting for eating as well as to go to the bathroom. In review of her labs in the emergency room, there was no signs of any renal failure or infection. CT scan of the head was unremarkable. Noting her slightly elevated calcium levels with normal level several months  ago, suspected that perhaps hypercalcemia may be playing a role in patient's acute worsening mentation. Workup was done, see above, however I cannot attest that patient's mentation will improve if her calcium levels  improve. Unfortunately, patient was evaluated by physical therapy who did not find her to have a skilled need. She could not follow directions consistently for physical therapy. It was recommended that the patient go to a assisted living facility with memory care unit. Unfortunately, at this time, that would be of the patient's husband would have to pay out of pocket-which would be the equivalent of approximately $5000 a month. The patient's husband has appointment with DSS to find out if he will preliminary qualify for Medicaid which will cover than most of assisted livings memory care unit cost. In the interim, we have provided the patient's husband as well as the patient's sister with a list of private nurses to provide 24-hour care which they say that they can afford to short-term and significantly cheaper and pain $5000 a month.  Would also recommend as per patient's primary care physician that if the patient's mentation does not improve with calcium levels resolving, thought could be made to refer the patient to home hospice in that given that that would indicate that she is in advanced stages of dementia. If her mentation does improve slightly with her calcium levels, patient to be reassessed for skilled nursing needs.  Hypertension: Stable.   Atrial fibrillation: Patient was noted to have irregular rhythm with P waves in addition to her left bundle branch block.  Would do little about this. Her rate is already controlled. She certainly not a candidate for anticoagulation. It is more of note.  Left bundle branch block: Old.    Hypothyroidism: TSH checked and found to be normal. Continue on Synthroid.     Procedures:   none  Consultations:  None  Discharge Exam: Filed Vitals:   11/23/12 1340 11/23/12 2100 11/24/12 0508 11/24/12 1300  BP:  131/84 134/75 160/83  Pulse: 102 83 93 103  Temp:  97.9 F (36.6 C) 98.2 F (36.8 C) 98.3 F (36.8 C)  TempSrc:  Oral  Oral  Resp: 19 16 18 18    Height:      Weight:      SpO2: 100% 98% 96% 98%    General: A&O x 1.  At times somnolent Cardiovascular: Irreg, rt controlled Respiratory: CTA bilaterally Abdomen: Soft, nontender, nondistended, positive bowel sounds Extremities: No clubbing or cyanosis or edema.  Discharge Instructions  Discharge Orders   Future Appointments Provider Department Dept Phone   12/11/2012 12:00 PM Ileana Ladd, MD [facility] [phone]   Future Orders Complete By Expires     Diet - low sodium heart healthy  As directed     Increase activity slowly  As directed         Medication List    STOP taking these medications       VITAMIN D PO      TAKE these medications       amLODipine 10 MG tablet  Commonly known as:  NORVASC  Take 10 mg by mouth daily.     aspirin EC 81 MG tablet  Take 81 mg by mouth at bedtime.     atorvastatin 20 MG tablet  Commonly known as:  LIPITOR  Take 20 mg by mouth at bedtime.     citalopram 10 MG tablet  Commonly known as:  CELEXA  Take 10 mg by mouth  daily.     cyanocobalamin 1000 MCG tablet  Take 1 tablet (1,000 mcg total) by mouth daily.     feeding supplement Liqd  Take 237 mLs by mouth 3 (three) times daily with meals.     fenofibrate 160 MG tablet  Take 1 tablet (160 mg total) by mouth daily.     levothyroxine 75 MCG tablet  Commonly known as:  SYNTHROID, LEVOTHROID  Take 75 mcg by mouth daily before breakfast.     LORazepam 1 MG tablet  Commonly known as:  ATIVAN  Take 1 tablet (1 mg total) by mouth at bedtime as needed for anxiety.     megestrol 625 MG/5ML suspension  Commonly known as:  MEGACE ES  Take 625 mg by mouth daily.     memantine 10 MG tablet  Commonly known as:  NAMENDA  Take 1 tablet (10 mg total) by mouth daily.       stop taking this medicine   nitrofurantoin (macrocrystal-monohydrate) 100 MG capsule  Commonly known as:  MACROBID  Take 1 capsule (100 mg total) by mouth 2 (two) times  daily.       Allergies  Allergen Reactions  . Penicillins     Advised was allergic to penicillin early in life.       Follow-up Information   Follow up with [facility].   Contact information:   [address] [place] [address] [phone]      Follow up with Redmond Baseman, MD In 1 week.   Contact information:   [address] [place] [address] [phone]        The results of significant diagnostics from this hospitalization (including imaging, microbiology, ancillary and laboratory) are listed below for reference.    Significant Diagnostic Studies: Dg Chest 1 View  11/23/2012     IMPRESSION: No focal acute cardiopulmonary process. If the patient's symptoms continue, consider PA and lateral chest radiographs obtained at full inspiration when the patient is clinically able.   Original Report Authenticated By: Christiana Pellant, M.D.   Ct Head Wo Contrast  11/23/2012   * IMPRESSION: No acute intracranial finding.   Original Report Authenticated By: Christiana Pellant, M.D.    Microbiology: Recent Results (from the past 240 hour(s))  MRSA PCR SCREENING     Status: None   Collection Time    11/23/12  2:05 PM      Result Value Range Status   MRSA by PCR NEGATIVE  NEGATIVE Final   Comment:            The GeneXpert MRSA Assay (FDA     approved for NASAL specimens     only), is one component of a     comprehensive MRSA colonization     surveillance program. It is not     intended to diagnose MRSA     infection nor to guide or     monitor treatment for     MRSA infections.     Labs: Basic Metabolic Panel:  Recent Labs Lab 11/23/12 1051 11/23/12 1639 11/24/12 0444 11/24/12 1005  NA 140  --  141  --   K 3.4*  --  3.1*  --   CL 106  --  110  --   CO2 29  --  27  --   GLUCOSE 88  --  100*  --   BUN 14  --  17  --   CREATININE 1.44*  --  1.46*  --  CALCIUM 11.4*  --  10.4  --   MG  --   --   --  2.0  PHOS  --  1.7*  --   --     Liver Function Tests:  Recent Labs Lab 11/23/12 1051  AST 42*  ALT 15  ALKPHOS 40  BILITOT 1.2  PROT 6.9  ALBUMIN 3.6   CBC:  Recent Labs Lab 11/23/12 1051  WBC 3.6*  NEUTROABS 2.4  HGB 10.5*  HCT 32.0*  MCV 98.8  PLT 74*     Signed:  KRISHNAN,SENDIL K  [facility] 11/24/2012, 4:17 PM

## 2012-11-24 NOTE — Clinical Social Work Note (Signed)
CSW met with pt's husband and sister along with MD. Pt's husband is very concerned about cost of ALF. He understands that no one can guarantee she will qualify for Medicaid until application is complete which they plan to do on Thursday when pt's son is available. CSW left message for financial counselor, Kathie Rhodes to see if further information could be given. Family is aware to notify DSS that they are looking at ALF- memory care unit. They have already contacted DSS to get list of documents needed to apply. Pt's husband has decided to hire private duty care at home until [facility] application is complete. CSW notified [place] of decision and requested that they follow up with family regarding potential admission. Pt will have home health social worker and Char[employer] fundraiser. Notified [facility] that TB skin test will need to be read on Thursday. CSW spoke with pt's daughter-in-law to update her on husband's decision. She reports she should be able to help out until Thursday as well. Aware they will have to have PCP complete FL2.   [name], [place] [phone]

## 2012-11-24 NOTE — Progress Notes (Signed)
UR chart review completed.  

## 2012-11-24 NOTE — Progress Notes (Signed)
Pt. D/c instructions reviewed with pts. Family, IV removed by patient, prescriptions verified with family, sending home with follow [facility] with [facility]. Family to follow up about medicaid approval.

## 2012-11-24 NOTE — Evaluation (Signed)
Physical Therapy Evaluation Patient Details Name: Kristen Wheeler MRN: [id number] DOB: July 19, 1940 Today's Date: 11/24/2012 Time: 1308-6578 PT Time Calculation (min): 42 min  PT Assessment / Plan / Recommendation History of Present Illness  Pt is admitted for observation due to husband's c/o that pt  is having increased agitation at night and progressive weakness and decreased balance over the past few months.  Family can no longer manage her.  Clinical Impression   Pt is extremely lethargic this morning and has been very dificult to awaken.  Per RN report, she was much more alert (possibly agitated) last night and was trying to crawl OOB.  Per RN, she has not had any drugs that would be sedating at this time.  It was quite difficult to determine her strength as she was not able to participate in a standard MMT.  She is able to move all extremeties.  She needed mod to max assistance to transfer OOB.  She was able to ambulate with and without a walker with a shuffling gait pattern, but her standing balance is very poor and she seems to have lost her righting reflexes that help to regain balance.  Pt is disoriented times 4 and her behavior is "zombie-like" where she appears to be in a daze.  In my opinion, she would be very difficult to care for at home and should be in a nursing home/dementia unit setting.  She is not appropriate for Rehab.    PT Assessment  Patent does not need any further PT services    Follow Up Recommendations  No PT follow up    Does the patient have the potential to tolerate intense rehabilitation    no  Barriers to Discharge        Equipment Recommendations  None recommended by PT (will need a w/c if she doesn't have one)    Recommendations for Other Services     Frequency      Precautions / Restrictions Precautions Precautions: Fall Restrictions Weight Bearing Restrictions: No   Pertinent Vitals/Pain       Mobility  Bed Mobility Bed Mobility: Supine  to Sit;Sit to Supine;Sitting - Scoot to Edge of Bed Supine to Sit: 2: Max assist;HOB elevated Sitting - Scoot to [place] of Bed: 2: Max assist Sit to Supine: 3: Mod assist Transfers Transfers: Sit to Stand;Stand to Sit Sit to Stand: 4: Min assist;With upper extremity assist;From bed Stand to Sit: 4: Min assist;To bed Ambulation/Gait Ambulation/Gait Assistance: 3: Mod assist Ambulation Distance (Feet): 35 Feet Assistive device: Rolling walker;None Ambulation/Gait Assistance Details: Pt is able to ambulate with a walker but does not really lean on it to any degree.Marland KitchenMarland KitchenI had her ambulate without AD and she is able to do short distances but balance is poor and she will fall straight backward with no attempt to regain her balance Gait Pattern: Shuffle;Narrow base of support Gait velocity: very slow and labored General Gait Details: gait is very unstable with or without a walker...her standing balance is significantly compromised and in my opinion, she should not be ambulatory and should mobilize with a w/c Stairs: No Wheelchair Mobility Wheelchair Mobility: No    Exercises     PT Diagnosis:    PT Problem List:   PT Treatment Interventions:       PT Goals(Current goals can be found in the care plan section) Acute Rehab PT Goals PT Goal Formulation: No goals set, d/c therapy  Visit Information  Last PT Received On: 11/24/12 History of Present  Illness: Pt is admitted for observation due to husband's c/o that pt  is having increased agitation at night and progressive weakness and decreased balance over the past few months.  Family can no longer manage her.       Prior Functioning  Prior Function Level of Independence: Needs assistance Gait / Transfers Assistance Needed: apparently needed assistance to ambulate with a walker ADL's / Homemaking Assistance Needed: I am assuming that she needed assistance with all ADLs due to poor cognition Communication Communication: No difficulties     Cognition  Cognition Arousal/Alertness: Lethargic Behavior During Therapy: Flat affect Overall Cognitive Status: No family/caregiver present to determine baseline cognitive functioning Memory:  (pt is disorientd times 4)    Extremity/Trunk Assessment Upper Extremity Assessment Upper Extremity Assessment: Difficult to assess due to impaired cognition Lower Extremity Assessment Lower Extremity Assessment: Difficult to assess due to impaired cognition   Balance Balance Balance Assessed: Yes Static Sitting Balance Static Sitting - Balance Support: Feet supported;No upper extremity supported Static Sitting - Level of Assistance: 5: Stand by assistance Static Standing Balance Static Standing - Balance Support: No upper extremity supported Static Standing - Level of Assistance: 3: Mod assist  End of Session PT - End of Session Equipment Utilized During Treatment: Gait belt Activity Tolerance: Patient limited by fatigue;Patient limited by lethargy Patient left: in bed;with call bell/phone within reach;with bed alarm set Nurse Communication: Mobility status  GP Functional Assessment Tool Used: clinical judgement Functional Limitation: Mobility: Walking and moving around Mobility: Walking and Moving Around Current Status ([id number]): At least 40 percent but less than 60 percent impaired, limited or restricted Mobility: Walking and Moving Around Goal Status [phone]): At least 40 percent but less than 60 percent impaired, limited or restricted Mobility: Walking and Moving Around Discharge Status [phone]): At least 40 percent but less than 60 percent impaired, limited or restricted   Konrad Penta 11/24/2012, 11:08 AM

## 2012-11-24 NOTE — Care Management Note (Addendum)
    Page 1 of 2   11/24/2012     2:44:56 PM   CARE MANAGEMENT NOTE 11/24/2012  Patient:  Kristen Wheeler, Kristen Wheeler   Account Number:  [id number]  Date Initiated:  11/24/2012  Documentation initiated by:  Sharrie Rothman  Subjective/Objective Assessment:   Pt admitted from home with altered mental status. Pt lives at home with husband and a son, but husband is deaf and unable to care for pt at this time.Pts son is in and out and is not able to provide care. Pts son and daughter in law are     Action/Plan:   providing as much help as they can but live about 10 mins away. PT recommends ALF at discharge. Family has already checked with [facility]. CSw working with family and facility for discharge at facility.   Anticipated DC Date:  11/24/2012   Anticipated DC Plan:  ASSISTED LIVING / REST HOME  In-house referral  Clinical Social Worker      DC Associate Professor  CM consult      [facility] Choice  HOME HEALTH   Choice offered to / List presented to:  C-3 Spouse        HH arranged  HH-1 RN  HH-4 NURSE'S AIDE  HH-6 SOCIAL WORKER      HH agency  [facility]   Status of service:  Completed, signed off Medicare Important Message given?  NA - LOS <3 / Initial given by admissions (If response is "NO", the following Medicare IM given date fields will be blank) Date Medicare IM given:   Date Additional Medicare IM given:    Discharge Disposition:  HOME W HOME HEALTH SERVICES  Per UR Regulation:    If discussed at Long Length of Stay Meetings, dates discussed:    Comments:  11/24/12 1442 [name], RN BSN CM Pt to be discharged home today with [facility] RN, aide, and CSW. [name] of [facility] is aware and will collect the pts information from the chart. HH services to start within 48 hours. No DME needs noted. Private duty sitter list also given to pts sister and husband. Pt and pts nurse aware of discharge arrangements.  11/24/12 1200 [name], RN BSN CM

## 2012-11-24 NOTE — Progress Notes (Signed)
INITIAL NUTRITION ASSESSMENT  DOCUMENTATION CODES Per approved criteria  -Not Applicable   INTERVENTION: RD will follow for nutrition needs  NUTRITION DIAGNOSIS: Inadequate oral intake related to dementia with behavior disturbance as evidenced by PMH increased combativeness and decreased food / beverage intake (unable to feed herself)   Goal: Pt to meet >/= 90% of their estimated nutrition needs   Monitor:  Po intake, labs and wt trends  Reason for Assessment: Malnutrition Screen Score = 3  72 y.o. female  Admitting Dx: Dementia with behavioral disturbance  ASSESSMENT: Hx of poor po intake. Megace for appetite. Talked with daughter-in law by telephone.  She reports pt from home lives with spouse.Pt  has been declining past several months. She has 10#,6% unplanned wt loss x 90 days trending toward significant. Decreased oral intake. Pt no longer able to feed herself and has been increasingly combative. Family is feeling frustrated by their lack of ability to manage their mom at home and verbalize they are not getting the help they need.   Suspect pt is malnourished given her reported decline in ability to feed herself and "forgets to eat" but unable to diagnose at this time wt loss is not yet significant and detailed diet hx unavailable.   Nutrition Focused Physical Exam:  Subcutaneous Fat:  Orbital Region: WNL Upper Arm Region: WNL Thoracic and Lumbar Region: n/a  Muscle:  Temple Region: mild-moderate malnutrition Clavicle Bone Region: WNL Clavicle and Acromion Bone Region: WNL Scapular Bone Region: n/a Dorsal Hand: mild-moderate Patellar Region: WNL Anterior Thigh Region: WNL Posterior Calf Region: WNL  Edema: none noted    Height: Ht Readings from Last 1 Encounters:  11/23/12 5\' 5"  (1.651 m)    Weight: Wt Readings from Last 1 Encounters:  11/23/12 150 lb (68.04 kg)    Ideal Body Weight: 125# (56.8kg)  % Ideal Body Weight: 120%  Wt Readings from Last 10  Encounters:  11/23/12 150 lb (68.04 kg)  11/13/12 149 lb 3.2 oz (67.677 kg)  10/19/12 151 lb (68.493 kg)  08/25/12 160 lb 0.9 oz (72.6 kg)  05/28/11 160 lb (72.576 kg)  05/23/11 161 lb (73.029 kg)  05/15/11 155 lb (70.308 kg)  05/15/11 155 lb (70.308 kg)  04/16/11 158 lb 12.8 oz (72.031 kg)  04/13/11 158 lb 12.8 oz (72.031 kg)    Usual Body Weight: 155-160#  % Usual Body Weight: 97%  BMI:  Body mass index is 24.96 kg/(m^2).normal range  Estimated Nutritional Needs: Kcal: 1500-1700 Protein: 65-75 gr Fluid: 2000 ml/day  Skin: No issues noted  Diet Order: Sodium Restricted  EDUCATION NEEDS: -Education needs addressed   Intake/Output Summary (Last 24 hours) at 11/24/12 0839 Last data filed at 11/23/12 1700  Gross per 24 hour  Intake    483 ml  Output      0 ml  Net    483 ml    Last BM: PTA  Labs:   Recent Labs Lab 11/23/12 1051 11/23/12 1639 11/24/12 0444  NA 140  --  141  K 3.4*  --  3.1*  CL 106  --  110  CO2 29  --  27  BUN 14  --  17  CREATININE 1.44*  --  1.46*  CALCIUM 11.4*  --  10.4  PHOS  --  1.7*  --   GLUCOSE 88  --  100*    CBG (last 3)  No results found for this basename: GLUCAP,  in the last 72 hours  Scheduled Meds: . amLODipine  10 mg Oral Daily  . aspirin EC  81 mg Oral QHS  . atorvastatin  20 mg Oral QHS  . citalopram  10 mg Oral Daily  . enoxaparin (LOVENOX) injection  30 mg Subcutaneous Q24H  . feeding supplement  237 mL Oral TID WC  . fenofibrate  160 mg Oral Daily  . levothyroxine  75 mcg Oral QAC breakfast  . LORazepam  0.5 mg Oral QHS  . megestrol  400 mg Oral BID  . memantine  10 mg Oral Daily  . nitrofurantoin (macrocrystal-monohydrate)  100 mg Oral BID  . pneumococcal 23 valent vaccine  0.5 mL Intramuscular Tomorrow-1000  . sodium chloride  3 mL Intravenous Q12H  . cyanocobalamin  1,000 mcg Oral Daily    Continuous Infusions:   Past Medical History  Diagnosis Date  . Dementia   . Thyroid disease   .  Hyperlipidemia   . Hypertension     primary Dr. Modesto Charon, saw last Oct 2012,  . Breast cancer     Past Surgical History  Procedure Laterality Date  . Appendectomy    . Breast surgery      Royann Shivers MS,RD,LDN,CSG Office: #[phone] Pager: [phone]

## 2012-11-24 NOTE — Clinical Social Work Note (Deleted)
CSW presented bed offers and pt and daughter choose [address]. Facility notified. Per daughter, kyphoplasty is scheduled for Thursday. CSW notified facility of procedure.  [name], [place] [phone]

## 2012-11-25 NOTE — Progress Notes (Signed)
Quick Note:  Labs abnormal. Vitamin D low Start on Vit D 1000 units daily. ______

## 2012-11-26 ENCOUNTER — Telehealth: Payer: Self-pay | Admitting: Family Medicine

## 2012-11-26 DIAGNOSIS — R269 Unspecified abnormalities of gait and mobility: Secondary | ICD-10-CM

## 2012-11-26 DIAGNOSIS — F0390 Unspecified dementia without behavioral disturbance: Secondary | ICD-10-CM

## 2012-11-27 ENCOUNTER — Telehealth: Payer: Self-pay | Admitting: Family Medicine

## 2012-11-27 LAB — VITAMIN D 1,25 DIHYDROXY: Vitamin D 1, 25 (OH)2 Total: 14 pg/mL — ABNORMAL LOW (ref 18–72)

## 2012-11-27 NOTE — Telephone Encounter (Signed)
Spoke with Jaqcquleine and per Dr Modesto Charon pt will need to come in Monday for labs anda to try eating banananas since potassium a little low and increase fluids

## 2012-11-30 ENCOUNTER — Other Ambulatory Visit (INDEPENDENT_AMBULATORY_CARE_PROVIDER_SITE_OTHER): Payer: Medicare Other

## 2012-11-30 ENCOUNTER — Other Ambulatory Visit: Payer: Self-pay

## 2012-11-30 DIAGNOSIS — E876 Hypokalemia: Secondary | ICD-10-CM

## 2012-11-30 NOTE — Progress Notes (Signed)
Patient came in for labs only.

## 2012-12-01 ENCOUNTER — Other Ambulatory Visit: Payer: Self-pay | Admitting: Family Medicine

## 2012-12-01 ENCOUNTER — Telehealth: Payer: Self-pay | Admitting: Family Medicine

## 2012-12-01 DIAGNOSIS — E876 Hypokalemia: Secondary | ICD-10-CM

## 2012-12-01 LAB — PTH, INTACT AND CALCIUM: PTH: 42 pg/mL (ref 15–65)

## 2012-12-01 LAB — BMP8+EGFR
BUN/Creatinine Ratio: 9 — ABNORMAL LOW (ref 11–26)
BUN: 12 mg/dL (ref 8–27)
CO2: 24 mmol/L (ref 18–29)
Calcium: 10.6 mg/dL — ABNORMAL HIGH (ref 8.6–10.2)
Chloride: 107 mmol/L (ref 97–108)
Creatinine, Ser: 1.33 mg/dL — ABNORMAL HIGH (ref 0.57–1.00)
GFR calc Af Amer: 46 mL/min/{1.73_m2} — ABNORMAL LOW (ref >59)
GFR calc non Af Amer: 40 mL/min/{1.73_m2} — ABNORMAL LOW (ref >59)
Glucose: 107 mg/dL — ABNORMAL HIGH (ref 65–99)
Potassium: 3 mmol/L — ABNORMAL LOW (ref 3.5–5.2)
Sodium: 143 mmol/L (ref 134–144)

## 2012-12-01 MED ORDER — POTASSIUM CHLORIDE ER 10 MEQ PO TBCR: 10.0000 meq | EXTENDED_RELEASE_TABLET | Freq: Two times a day (BID) | ORAL | Status: DC

## 2012-12-01 NOTE — Telephone Encounter (Signed)
If her urine is looking like coca cola. She either has blood in the urine or a liver problem. Needs checked tomorrow or if  Fever or suddenly worse needs to be seen tomorrow.

## 2012-12-01 NOTE — Telephone Encounter (Signed)
Spoke with Kristen Wheeler and aware to have her home health nurse collect specimen and vital signs and to call results tomorrow .

## 2012-12-02 ENCOUNTER — Telehealth: Payer: Self-pay | Admitting: *Deleted

## 2012-12-02 NOTE — Telephone Encounter (Signed)
Pt is not urinating hardly at all, [facility] tried to get pt to urinate on toilet and she doesn't. [facility] ended up cathing her today and got 800cc of amber urine. Her BMP was done 11/30/12 and still has not been addressed. They are bringing a specimen to office for a u/a. Please advise what to do?

## 2012-12-02 NOTE — Telephone Encounter (Signed)
Notified our lab to add c&s, also notified Heather with [facility] about getting BMP tomorrow

## 2012-12-02 NOTE — Telephone Encounter (Signed)
Repeat BMP due to her low potassium Check urine for infection and do culture and sensitivity

## 2012-12-05 ENCOUNTER — Telehealth: Payer: Self-pay | Admitting: Family Medicine

## 2012-12-05 NOTE — Telephone Encounter (Signed)
Wants to know her urine results. When she got out of the hospital her discharge said she had chronic stage 3 kidney disease and no one her has told her that they would like to know about this

## 2012-12-06 NOTE — Telephone Encounter (Signed)
She has had some kidney dysfunction for some time most likely related to aging and hypertension. Not her major problem. It is the dementia that is her major problem and as her dementia gets worse it will be a bigger challenge to care for her at home. Have the family decided on placement?

## 2012-12-07 NOTE — Telephone Encounter (Signed)
Spoke with jacqueline and she was wanting urine results which we do not have and informed her to ask the home health nurse where she delivered the specimen and no placement yet. They are caring for her at home

## 2012-12-11 ENCOUNTER — Ambulatory Visit: Payer: Medicare Other | Admitting: Family Medicine

## 2012-12-14 ENCOUNTER — Telehealth: Payer: Self-pay | Admitting: Family Medicine

## 2012-12-14 NOTE — Telephone Encounter (Signed)
Home health please address

## 2012-12-15 NOTE — Telephone Encounter (Signed)
DONNA LAWSON WORKING ON FMLA NOW

## 2012-12-16 NOTE — Telephone Encounter (Signed)
Carren Rang took care of this and faxed form

## 2013-01-04 ENCOUNTER — Telehealth: Payer: Self-pay

## 2013-01-04 NOTE — Telephone Encounter (Signed)
Called daughter in law to inform her we need to do labs on pt and she verbalized that patient is at [facility]  Dr Modesto Charon aware.

## 2013-01-26 DIAGNOSIS — Z853 Personal history of malignant neoplasm of breast: Secondary | ICD-10-CM

## 2013-01-26 DIAGNOSIS — I10 Essential (primary) hypertension: Secondary | ICD-10-CM

## 2013-01-26 DIAGNOSIS — E039 Hypothyroidism, unspecified: Secondary | ICD-10-CM

## 2013-01-26 DIAGNOSIS — F0391 Unspecified dementia with behavioral disturbance: Secondary | ICD-10-CM

## 2013-01-26 DIAGNOSIS — F03918 Unspecified dementia, unspecified severity, with other behavioral disturbance: Secondary | ICD-10-CM

## 2013-04-23 ENCOUNTER — Encounter (HOSPITAL_COMMUNITY): Payer: Self-pay | Admitting: Emergency Medicine

## 2013-04-23 ENCOUNTER — Inpatient Hospital Stay (HOSPITAL_COMMUNITY)
Admission: EM | Admit: 2013-04-23 | Discharge: 2013-04-29 | DRG: 309 | Disposition: E | Payer: Medicare Other | Attending: Internal Medicine | Admitting: Internal Medicine

## 2013-04-23 ENCOUNTER — Emergency Department (HOSPITAL_COMMUNITY): Payer: Medicare Other

## 2013-04-23 DIAGNOSIS — E870 Hyperosmolality and hypernatremia: Secondary | ICD-10-CM

## 2013-04-23 DIAGNOSIS — E785 Hyperlipidemia, unspecified: Secondary | ICD-10-CM | POA: Diagnosis present

## 2013-04-23 DIAGNOSIS — E039 Hypothyroidism, unspecified: Secondary | ICD-10-CM | POA: Diagnosis present

## 2013-04-23 DIAGNOSIS — I10 Essential (primary) hypertension: Secondary | ICD-10-CM | POA: Diagnosis present

## 2013-04-23 DIAGNOSIS — Z66 Do not resuscitate: Secondary | ICD-10-CM | POA: Diagnosis present

## 2013-04-23 DIAGNOSIS — E860 Dehydration: Secondary | ICD-10-CM

## 2013-04-23 DIAGNOSIS — N183 Chronic kidney disease, stage 3 unspecified: Secondary | ICD-10-CM | POA: Diagnosis present

## 2013-04-23 DIAGNOSIS — F0390 Unspecified dementia without behavioral disturbance: Secondary | ICD-10-CM

## 2013-04-23 DIAGNOSIS — Z7982 Long term (current) use of aspirin: Secondary | ICD-10-CM

## 2013-04-23 DIAGNOSIS — Z515 Encounter for palliative care: Secondary | ICD-10-CM

## 2013-04-23 DIAGNOSIS — I4901 Ventricular fibrillation: Secondary | ICD-10-CM

## 2013-04-23 DIAGNOSIS — I428 Other cardiomyopathies: Secondary | ICD-10-CM | POA: Diagnosis present

## 2013-04-23 DIAGNOSIS — I442 Atrioventricular block, complete: Principal | ICD-10-CM

## 2013-04-23 DIAGNOSIS — I469 Cardiac arrest, cause unspecified: Secondary | ICD-10-CM | POA: Diagnosis present

## 2013-04-23 DIAGNOSIS — I447 Left bundle-branch block, unspecified: Secondary | ICD-10-CM | POA: Diagnosis present

## 2013-04-23 DIAGNOSIS — N179 Acute kidney failure, unspecified: Secondary | ICD-10-CM

## 2013-04-23 DIAGNOSIS — I129 Hypertensive chronic kidney disease with stage 1 through stage 4 chronic kidney disease, or unspecified chronic kidney disease: Secondary | ICD-10-CM | POA: Diagnosis present

## 2013-04-23 DIAGNOSIS — I4891 Unspecified atrial fibrillation: Secondary | ICD-10-CM | POA: Diagnosis present

## 2013-04-23 DIAGNOSIS — Z79899 Other long term (current) drug therapy: Secondary | ICD-10-CM

## 2013-04-23 HISTORY — DX: Paroxysmal atrial fibrillation: I48.0

## 2013-04-23 HISTORY — DX: Chronic kidney disease, stage 3 unspecified: N18.30

## 2013-04-23 HISTORY — DX: Other cardiomyopathies: I42.8

## 2013-04-23 HISTORY — DX: Chronic kidney disease, stage 3 (moderate): N18.3

## 2013-04-23 HISTORY — DX: Hypothyroidism, unspecified: E03.9

## 2013-04-23 LAB — BASIC METABOLIC PANEL WITH GFR
GFR calc Af Amer: 34 mL/min — ABNORMAL LOW (ref >90)
GFR calc non Af Amer: 29 mL/min — ABNORMAL LOW (ref >90)
Potassium: 3.9 meq/L (ref 3.5–5.1)
Sodium: 157 meq/L — ABNORMAL HIGH (ref 135–145)

## 2013-04-23 LAB — POCT I-STAT, CHEM 8
BUN: 54 mg/dL — ABNORMAL HIGH (ref 6–23)
Calcium, Ion: 1.72 mmol/L (ref 1.13–1.30)
Chloride: 120 mEq/L — ABNORMAL HIGH (ref 96–112)
Creatinine, Ser: 1.8 mg/dL — ABNORMAL HIGH (ref 0.50–1.10)
Glucose, Bld: 181 mg/dL — ABNORMAL HIGH (ref 70–99)
HCT: 41 % (ref 36.0–46.0)
Hemoglobin: 13.9 g/dL (ref 12.0–15.0)
Potassium: 4 meq/L (ref 3.5–5.1)
Sodium: 156 mEq/L — ABNORMAL HIGH (ref 135–145)
TCO2: 27 mmol/L (ref 0–100)

## 2013-04-23 LAB — POCT I-STAT TROPONIN I: Troponin i, poc: 0.74 ng/mL (ref 0.00–0.08)

## 2013-04-23 LAB — MAGNESIUM: Magnesium: 2.6 mg/dL — ABNORMAL HIGH (ref 1.5–2.5)

## 2013-04-23 LAB — CBC WITH DIFFERENTIAL/PLATELET
Basophils Absolute: 0 10*3/uL (ref 0.0–0.1)
Basophils Relative: 0 % (ref 0–1)
Eosinophils Absolute: 0.1 K/uL (ref 0.0–0.7)
Eosinophils Relative: 0 % (ref 0–5)
HCT: 42.7 % (ref 36.0–46.0)
Hemoglobin: 13.4 g/dL (ref 12.0–15.0)
Lymphocytes Relative: 8 % — ABNORMAL LOW (ref 12–46)
Lymphs Abs: 1.2 K/uL (ref 0.7–4.0)
MCH: 34.9 pg — ABNORMAL HIGH (ref 26.0–34.0)
MCHC: 31.4 g/dL (ref 30.0–36.0)
MCV: 111.2 fL — ABNORMAL HIGH (ref 78.0–100.0)
Monocytes Absolute: 0.5 10*3/uL (ref 0.1–1.0)
Monocytes Relative: 3 % (ref 3–12)
Neutro Abs: 13.6 10*3/uL — ABNORMAL HIGH (ref 1.7–7.7)
Neutrophils Relative %: 88 % — ABNORMAL HIGH (ref 43–77)
Platelets: 103 K/uL — ABNORMAL LOW (ref 150–400)
RBC: 3.84 MIL/uL — ABNORMAL LOW (ref 3.87–5.11)
RDW: 13.5 % (ref 11.5–15.5)
WBC: 15.4 10*3/uL — ABNORMAL HIGH (ref 4.0–10.5)

## 2013-04-23 LAB — APTT: aPTT: 30 seconds (ref 24–37)

## 2013-04-23 LAB — BASIC METABOLIC PANEL
BUN: 53 mg/dL — ABNORMAL HIGH (ref 6–23)
CO2: 26 mEq/L (ref 19–32)
Calcium: 12.8 mg/dL — ABNORMAL HIGH (ref 8.4–10.5)
Chloride: 117 mEq/L — ABNORMAL HIGH (ref 96–112)
Creatinine, Ser: 1.68 mg/dL — ABNORMAL HIGH (ref 0.50–1.10)
Glucose, Bld: 186 mg/dL — ABNORMAL HIGH (ref 70–99)

## 2013-04-23 LAB — PROTIME-INR
INR: 1.23 (ref 0.00–1.49)
Prothrombin Time: 15.2 s (ref 11.6–15.2)

## 2013-04-23 LAB — CK: Total CK: 189 U/L — ABNORMAL HIGH (ref 7–177)

## 2013-04-23 MED ORDER — ACETAMINOPHEN 650 MG RE SUPP: 650.0000 mg | Freq: Four times a day (QID) | RECTAL | Status: DC | PRN

## 2013-04-23 MED ORDER — ONDANSETRON HCL 4 MG PO TABS: 4.0000 mg | ORAL_TABLET | Freq: Four times a day (QID) | ORAL | Status: DC | PRN

## 2013-04-23 MED ORDER — MORPHINE SULFATE 2 MG/ML IJ SOLN
1.0000 mg | INTRAMUSCULAR | Status: DC | PRN
  Administered 2013-04-24: 1 mg via INTRAVENOUS
  Filled 2013-04-23: qty 1

## 2013-04-23 MED ORDER — ONDANSETRON HCL 4 MG/2ML IJ SOLN: 4.0000 mg | Freq: Four times a day (QID) | INTRAMUSCULAR | Status: DC | PRN

## 2013-04-23 MED ORDER — CITALOPRAM HYDROBROMIDE 10 MG PO TABS
10.0000 mg | ORAL_TABLET | Freq: Every day | ORAL | Status: DC
  Filled 2013-04-23: qty 1

## 2013-04-23 MED ORDER — ASPIRIN EC 81 MG PO TBEC
81.0000 mg | DELAYED_RELEASE_TABLET | Freq: Every day | ORAL | Status: DC
  Filled 2013-04-23 (×2): qty 1

## 2013-04-23 MED ORDER — SODIUM CHLORIDE 0.9 % IV BOLUS (SEPSIS)
1000.0000 mL | Freq: Once | INTRAVENOUS | Status: AC
  Administered 2013-04-23: 1000 mL via INTRAVENOUS

## 2013-04-23 MED ORDER — DEXTROSE 5 % IV SOLN
INTRAVENOUS | Status: DC
  Administered 2013-04-23 – 2013-04-24 (×2): via INTRAVENOUS

## 2013-04-23 MED ORDER — ADULT MULTIVITAMIN W/MINERALS CH
1.0000 | ORAL_TABLET | Freq: Every day | ORAL | Status: DC
  Filled 2013-04-23: qty 1

## 2013-04-23 MED ORDER — LEVOTHYROXINE SODIUM 150 MCG PO TABS
150.0000 ug | ORAL_TABLET | Freq: Every day | ORAL | Status: DC
  Filled 2013-04-23 (×2): qty 1

## 2013-04-23 MED ORDER — LORAZEPAM 0.5 MG PO TABS: 0.5000 mg | ORAL_TABLET | Freq: Once | ORAL | Status: DC

## 2013-04-23 MED ORDER — ACETAMINOPHEN 325 MG PO TABS: 650.0000 mg | ORAL_TABLET | Freq: Four times a day (QID) | ORAL | Status: DC | PRN

## 2013-04-23 NOTE — ED Provider Notes (Signed)
CSN: [id number]     Arrival date & time 03/29/2013  1735 History   First MD Initiated Contact with Patient 04/01/2013 1743     Chief Complaint  Patient presents with  . Cardiac Arrest   (Consider location/radiation/quality/duration/timing/severity/associated sxs/prior Treatment) HPI Patient presented via EMS as cardiac arrest. Arrested at 1630, EMS arrived to find patient in Vgib. Shocked x3, epi x2, atropine 2/2 bradycardia. Arrived with [facility].   Past Medical History  Diagnosis Date  . Dementia   . Thyroid disease   . Hyperlipidemia   . Hypertension     primary Dr. Modesto Charon, saw last Oct 2012,  . Breast cancer   . CKD (chronic kidney disease) stage 3, GFR 30-59 ml/min   . Hypothyroidism   . PAF (paroxysmal atrial fibrillation)   . Nonischemic cardiomyopathy    Past Surgical History  Procedure Laterality Date  . Appendectomy    . Breast surgery     Family History  Problem Relation Age of Onset  . Diabetes Mother   . Coronary artery disease Mother   . Coronary artery disease Father    History  Substance Use Topics  . Smoking status: Never Smoker   . Smokeless tobacco: Not on file  . Alcohol Use: No   OB History   Grav Para Term Preterm Abortions TAB SAB Ect Mult Living                 Review of Systems  Unable to perform ROS: Intubated    Allergies  Penicillins  Home Medications   No current outpatient prescriptions on file. BP 142/52  Pulse 63  Temp(Src) 97.9 F (36.6 C) (Oral)  Resp 14  Wt 126 lb 12.2 oz (57.5 kg)  SpO2 100% Physical Exam  Constitutional: She appears well-developed and well-nourished. She is cooperative. She has a sickly appearance. She is intubated.  HENT:  Head: Normocephalic and atraumatic.  Eyes: Right pupil is round and reactive. Left pupil is round and reactive.  Neck: Normal range of motion. Neck supple.  Cardiovascular: S1 normal and S2 normal.  Bradycardia present.   Pulmonary/Chest: She is intubated. She has no wheezes.   Abdominal: Soft. Normal appearance. There is no tenderness.  Neurological: She is alert.    ED Course  Procedures (including critical care time) Labs Review Labs Reviewed  CBC WITH DIFFERENTIAL - Abnormal; Notable for the following:    WBC 15.4 (*)    RBC 3.84 (*)    MCV 111.2 (*)    MCH 34.9 (*)    Platelets 103 (*)    Neutrophils Relative % 88 (*)    Neutro Abs 13.6 (*)    Lymphocytes Relative 8 (*)    All other components within normal limits  BASIC METABOLIC PANEL - Abnormal; Notable for the following:    Sodium 157 (*)    Chloride 117 (*)    Glucose, Bld 186 (*)    BUN 53 (*)    Creatinine, Ser 1.68 (*)    Calcium 12.8 (*)    GFR calc non Af Amer 29 (*)    GFR calc Af Amer 34 (*)    All other components within normal limits  CK - Abnormal; Notable for the following:    Total CK 189 (*)    All other components within normal limits  MAGNESIUM - Abnormal; Notable for the following:    Magnesium 2.6 (*)    All other components within normal limits  POCT I-STAT, CHEM 8 - Abnormal;  Notable for the following:    Sodium 156 (*)    Chloride 120 (*)    BUN 54 (*)    Creatinine, Ser 1.80 (*)    Glucose, Bld 181 (*)    Calcium, Ion 1.72 (*)    All other components within normal limits  POCT I-STAT TROPONIN I - Abnormal; Notable for the following:    Troponin i, poc 0.74 (*)    All other components within normal limits  MRSA PCR SCREENING  APTT  PROTIME-INR  TSH   Imaging Review Dg Chest Port 1 View  2013-05-22   CLINICAL DATA:  Cardiopulmonary resuscitation.  Chest pain.  EXAM: PORTABLE CHEST - 1 VIEW  COMPARISON:  11/23/2012.  FINDINGS: Cardiopericardial silhouette is enlarged. There is no pneumothorax. No displaced rib fractures are identified. Surgical clips in the right breast. Monitoring leads project over the chest.  There is no edema. No airspace consolidation. Mild retrocardiac atelectasis.  IMPRESSION: Cardiomegaly without failure.   Electronically Signed    By: Andreas Newport M.D.   On: 05-22-13 18:12    EKG Interpretation    Date/Time:  Friday 05-22-13 17:54:29 EST Ventricular Rate:  37 PR Interval:    QRS Duration: 143 QT Interval:  676 QTC Calculation: 530 R Axis:   -91 Text Interpretation:  Complete (3-degree) AV block Junctional escape complexes or rhythm Left bundle branch block Abnormal ekg Confirmed by [facility]  MD, MICHEAL ([id number]) on 2013/05/22 7:44:02 PM            MDM   1. Ventricular fibrillation   2. Complete heart block   3. Acute kidney injury   4. Dementia   5. Hypercalcemia   6. Dehydration   7. Hypernatremia    72 yo F presents with cardiac arrest, with vfib.   Upon arrival, patient alert, following commands. King airway in place. Airway removed, patient able to maintain her own airway adequately, good sats on RA. Patient bradycardia. EKG demonstrates complete heart block. Cardiology consulted, will follow. Patient admitted to medicine for telemetry monitoring, possible pacing. Admitted in critical, but stable condition. Patient seen and evaluated by myself and my attending, Dr. Oletta Lamas.      Imagene Sheller, MD 04/24/13 [phone]

## 2013-04-23 NOTE — H&P (Signed)
[facility] History and Physical  Kristen Wheeler ZOX:[id number] DOB: 09/23/1940 DOA: 04/18/2013  Referring physician: ER patient. PCP: Redmond Baseman, MD   Chief Complaint: Status post cardiac arrest.  HPI: Kristen Wheeler is a 72 y.o. female history of advanced dementia, hypothyroidism, hypertension, hyperlipidemia, atrial fibrillation, nonischemic cardiomyopathy, chronic LBBB, chronic kidney disease was brought to the ER after patient was found to be slumped in the chair and was found to be in V. fib arrest and patient was resuscitated with Defib, atropine and epinephrine. Patient in the ER at this time has complete heart block and is back to her baseline mental status. Cardiology was consulted. At this time cardiology is not planning any active interventions. Patient has multiple metabolic derangements including hypernatremia and acute on chronic renal failure. Patient has mild leukocytosis and mildly elevated troponin. I have discussed with patient's husband and son and at this time patient has been made fully comfort measures and admitted for only comfort measures. No further labs will be ordered. As per patient's son patient has not been eating well for last week or 2. Denies any complaints of nausea vomiting abdominal pain diarrhea chest pain or shortness of breath.  Review of Systems: As presented in the history of presenting illness, rest negative.  Past Medical History  Diagnosis Date  . Dementia   . Thyroid disease   . Hyperlipidemia   . Hypertension     primary Dr. Modesto Charon, saw last Oct 2012,  . Breast cancer   . CKD (chronic kidney disease) stage 3, GFR 30-59 ml/min   . Hypothyroidism   . PAF (paroxysmal atrial fibrillation)   . Nonischemic cardiomyopathy    Past Surgical History  Procedure Laterality Date  . Appendectomy    . Breast surgery     Social History:  reports that she has never smoked. She does not have any smokeless tobacco history on file. She  reports that she does not drink alcohol or use illicit drugs. Where does patient live nursing home. Can patient participate in ADLs? No.  Allergies  Allergen Reactions  . Penicillins Other (See Comments)    Advised was allergic to penicillin early in life.    Family History:  Family History  Problem Relation Age of Onset  . Diabetes Mother   . Coronary artery disease Mother   . Coronary artery disease Father       Prior to Admission medications   Medication Sig Start Date End Date Taking? Authorizing Provider  Amino Acids-Protein Hydrolys (FEEDING SUPPLEMENT, PRO-STAT SUGAR FREE 64,) LIQD Take 30 mLs by mouth daily.   Yes Historical Provider, MD  amLODipine (NORVASC) 10 MG tablet Take 10 mg by mouth daily.   Yes Historical Provider, MD  aspirin EC 81 MG tablet Take 81 mg by mouth at bedtime.   Yes Historical Provider, MD  atorvastatin (LIPITOR) 20 MG tablet Take 20 mg by mouth at bedtime.    Yes Historical Provider, MD  citalopram (CELEXA) 10 MG tablet Take 10 mg by mouth daily. 11/09/12  Yes Historical Provider, MD  levothyroxine (SYNTHROID, LEVOTHROID) 150 MCG tablet Take 150 mcg by mouth daily before breakfast.   Yes Historical Provider, MD  memantine (NAMENDA) 10 MG tablet Take 1 tablet (10 mg total) by mouth daily. 11/13/12  Yes Ileana Ladd, MD  Multiple Vitamin (MULTIVITAMIN WITH MINERALS) TABS tablet Take 1 tablet by mouth daily.   Yes Historical Provider, MD  polyethylene glycol (MIRALAX / GLYCOLAX) packet Take 17 g by mouth daily.  Yes Historical Provider, MD    Physical Exam: Filed Vitals:   2013/05/11 1910 05-11-2013 1920 05/11/13 1930 05-11-2013 2030  BP: 133/53 142/46 134/53 135/54  Pulse: 44 41 45 40  Temp:      TempSrc:      Resp: 14 15 15 16   SpO2: 100% 100% 100% 100%     General:  Well-developed and poorly nourished.  Eyes: Anicteric no pallor.  ENT: No discharge from the ears eyes nose mouth.  Neck: No mass felt.  Cardiovascular: S1-S2  heard.  Respiratory: No rhonchi crepitations.  Abdomen: Soft nontender bowel sounds present.  Skin: No rash.  Musculoskeletal: No edema.  Psychiatric: Patient is demented.  Neurologic: Patient is demented and does not follow commands.  Labs on Admission:  Basic Metabolic Panel:  Recent Labs Lab 05/11/2013 1851 May 11, 2013 1900 05/11/2013 1926  NA 157*  --  156*  K 3.9  --  4.0  CL 117*  --  120*  CO2 26  --   --   GLUCOSE 186*  --  181*  BUN 53*  --  54*  CREATININE 1.68*  --  1.80*  CALCIUM 12.8*  --   --   MG  --  2.6*  --    Liver Function Tests: No results found for this basename: AST, ALT, ALKPHOS, BILITOT, PROT, ALBUMIN,  in the last 168 hours No results found for this basename: LIPASE, AMYLASE,  in the last 168 hours No results found for this basename: AMMONIA,  in the last 168 hours CBC:  Recent Labs Lab 05/11/13 1851 11-May-2013 1926  WBC 15.4*  --   NEUTROABS 13.6*  --   HGB 13.4 13.9  HCT 42.7 41.0  MCV 111.2*  --   PLT 103*  --    Cardiac Enzymes:  Recent Labs Lab May 11, 2013 1900  CKTOTAL 189*    BNP (last 3 results)  Recent Labs  08/22/12 2122  PROBNP 2395.0*   CBG: No results found for this basename: GLUCAP,  in the last 168 hours  Radiological Exams on Admission: Dg Chest Port 1 View  05-11-13   CLINICAL DATA:  Cardiopulmonary resuscitation.  Chest pain.  EXAM: PORTABLE CHEST - 1 VIEW  COMPARISON:  11/23/2012.  FINDINGS: Cardiopericardial silhouette is enlarged. There is no pneumothorax. No displaced rib fractures are identified. Surgical clips in the right breast. Monitoring leads project over the chest.  There is no edema. No airspace consolidation. Mild retrocardiac atelectasis.  IMPRESSION: Cardiomegaly without failure.   Electronically Signed   By: Andreas Newport M.D.   On: 05/11/2013 18:12    EKG: Independently reviewed. Complete heart block with deep T-wave inversions in the anterior leads.  Assessment/Plan Principal Problem:    Complete heart block Active Problems:   Hypertension   Dementia   Dehydration   Hypernatremia   Renal failure (ARF), acute on chronic   1. Status post be from cardiac arrest. 2. Complete heart block. 3. Hypernatremia probably from dehydration. 4. Acute on chronic renal failure probably from dehydration secondary to poor oral intake. 5. History of hyperthyroidism. 6. History of hypertension. 7. History of hyperlipidemia. 8. History of advanced dementia.  Plan - at this time I had discussed with patient's husband and son. As per the request patient has been made complete comfort measures with no aggressive measures. No further labs will be ordered. Patient will be continue gentle hydration and diet as tolerated. Consult hospice in a.m. Morphine when necessary for pain relief.  I have reviewed  patient's old charts compared old labs to the present one. Independently reviewed patient's EKG and had extensive discussion with patient's family.  Code Status: DO NOT RESUSCITATE.  Family Communication: Patient's son and husband at the bedside.  Disposition Plan: Admit to inpatient.    Jory Welke N. [facility] Pager [phone].  If 7PM-7AM, please contact night-coverage [email or web address] Password [facility] 04/28/2013, 8:55 PM

## 2013-04-23 NOTE — Code Documentation (Signed)
Pt arrives to the ED via [facility]. Pt was found slumped in the chair at the SNF breathing agonally and unresponsive and CPR was initiated 1630 by nursing home staff. Upon EMS arrival pt was in V. Fib. Pt was shocked x 3 with 200 joules and given 2 epis and 2 atropines PTA. Pulses returned at 16:55. Pt became alert and responsive to commands en route. Pt bradycardic in the 30s-40s at this time.

## 2013-04-23 NOTE — ED Notes (Addendum)
X-ray at bedside

## 2013-04-23 NOTE — ED Notes (Signed)
Pt non-verbal according to family and previous Charity fundraiser. Pt remains nonverbal, when asked how is is doing pt will turn head and look at you but will not answer per normal with family.

## 2013-04-23 NOTE — Progress Notes (Addendum)
Pt had 45 sec agonal breathing. 176/51, hr 43. pt now has respirations at 18. [facility] notified. No new orders at this time, per M. Lynch. Chaplain consulted and will visit pt and family shortly. Gilman Schmidt

## 2013-04-23 NOTE — ED Notes (Signed)
Cardiology spoke with family (husband and son) about patient. EDP consult to internal medicine.

## 2013-04-23 NOTE — Progress Notes (Addendum)
Zriyah Kopplin Prickett [id number] Admitted to 6E20: 04/22/2013 9:59 PM Attending Provider: Eduard Clos, MD   History:   unobtainable from patient due to mental status. Unable to complete admission due to pt being nonverbal  Admission INP armband ID in place. Pt nonverbal, unable to answer questions at this time. Side rails in place, fall risk assessment complete with family verbalizing understanding of risks associated with falls. Family verbalizes an understanding of how to use the call bell and to call for help before getting out of bed.   Will cont to monitor and assist as needed.  Gilman Schmidt, RN 04/01/2013 9:59 PM

## 2013-04-23 NOTE — ED Notes (Signed)
Cardiology at bedside.

## 2013-04-23 NOTE — ED Provider Notes (Addendum)
I saw and evaluated the patient, reviewed the resident's note and I agree with the findings and plan.  EKG Interpretation    Date/Time:  Friday April 23 2013 17:54:29 EST Ventricular Rate:  37 PR Interval:    QRS Duration: 143 QT Interval:  676 QTC Calculation: 530 R Axis:   -91 Text Interpretation:  Complete (3-degree) AV block Junctional escape complexes or rhythm Left bundle branch block Abnormal ekg Confirmed by Carle Dargan  MD, MICHEAL ([id number]) on 04/26/2013 7:44:02 PM            7:45 PM Pt shows complete heart block, s/p cardiac arrest, ventricular fib.  Discussed with Dr. Elease Hashimoto.  He will consult on patient.  Pt is no longer needing King airway, protecting airway.  K+ is normal on istat.  Troponin is slightly elevated only.  Labs suggest severe dehydration, will continue to hydrate and monitor.  Pt's IO placement in left tibia by EMS is questionable with effusion and edema around site.  Will likely need removal and monitoring to ensure resolution of edema and to watch for signs of infection.     CRITICAL CARE Performed by: Lear Ng Total critical care time: 30 min Critical care time was exclusive of separately billable procedures and treating other patients. Critical care was necessary to treat or prevent imminent or life-threatening deterioration. Critical care was time spent personally by me on the following activities: development of treatment plan with patient and/or surrogate as well as nursing, discussions with consultants, evaluation of patient's response to treatment, examination of patient, obtaining history from patient or surrogate, ordering and performing treatments and interventions, ordering and review of laboratory studies, ordering and review of radiographic studies, pulse oximetry and re-evaluation of patient's condition.   Impression: Complete heart block Ventricular fibrillation Infiltration at IO site    Gavin Pound. Oletta Lamas, MD 04/20/2013 1945  Gavin Pound. Ewell Benassi, MD 04/24/13 5621

## 2013-04-23 NOTE — Consult Note (Signed)
Cardiology Consult Note   Patient ID: KELCE BOUTON MRN: [id number], DOB/AGE: 04-May-1940   Admit date: 04-24-13 Date of Consult: 2013-04-24  Primary Physician: Redmond Baseman, MD Primary Cardiologist: Lovina Reach, MD  Reason for consult: cardiac arrest   HPI: Kristen Wheeler is a 72 y.o. female w/ minimal known PMHx including dementia, nonischemic cardiomyopathy (EF 55%), h/o atrial fibrillation, HTN, HLD, CKD (stage III), h/o hypercalcemia, h/o breast CA and hypothyroidism who presents to [facility] ED today post cardiac arrest.   She was evaluated once ~ 2 years ago by Dr. Tenny Craw for pre-op clearance prior to lung nodule surgery. LBBB was noted on EKG. She underwent Lexiscan Myoview revealed EF 34% with borderline mild reversibility. Follow-up 2D echo was ordered revealing EF 30-35%, grade 1 diastolic dysfunction, diffuse HK, mild LA dilatation. The studies were reviewed by Dr. Tenny Craw who felt this represented an nonischemic cardiomyopathy. Recommendations were made to monitor volume status peri-operatively, but overall low risk for ischemic event. She was admitted 10/2012 for AMS ultimately attributed to dementia. A-fib diagnosed at that time. Echo showed EF 55%, septal dyssenegery, mild LVH, mild LA dilatation.   In talking with her son, she had rapidly gone down hill since falling and having a traumatic head injury in April , 2014.  At baseline, she is noncomunicative.  She eats and drinks some although she has not been eating well recently.    She was apparently found slumped in her chair at SNF with agonal respirations. She was unresponsive and EMS was called. V-fib arrest identified in the field s/p defib x 3, epi x 2, atropine x 2 with ROSC. Alert & responsive en route. EKG shows complete heart block with junctional rhythm. TnI 0.74. Several metabolic derangements including Na 157, Cl 117, BUN 53/Cr 1.68, Ca 12.8. CBC- WBC 15.4, MCV 111, PLT 103K. CXR- cardiomegaly w/o failure.  Cardiology consulted.   Problem List: Past Medical History  Diagnosis Date  . Dementia   . Thyroid disease   . Hyperlipidemia   . Hypertension     primary Dr. Modesto Charon, saw last Oct 2012,  . Breast cancer   . CKD (chronic kidney disease) stage 3, GFR 30-59 ml/min   . Hypothyroidism   . PAF (paroxysmal atrial fibrillation)     Past Surgical History  Procedure Laterality Date  . Appendectomy    . Breast surgery       Allergies:  Allergies  Allergen Reactions  . Penicillins Other (See Comments)    Advised was allergic to penicillin early in life.    Home Medications: Prior to Admission medications   Medication Sig Start Date End Date Taking? Authorizing Provider  Amino Acids-Protein Hydrolys (FEEDING SUPPLEMENT, PRO-STAT SUGAR FREE 64,) LIQD Take 30 mLs by mouth daily.   Yes Historical Provider, MD  amLODipine (NORVASC) 10 MG tablet Take 10 mg by mouth daily.   Yes Historical Provider, MD  aspirin EC 81 MG tablet Take 81 mg by mouth at bedtime.   Yes Historical Provider, MD  atorvastatin (LIPITOR) 20 MG tablet Take 20 mg by mouth at bedtime.    Yes Historical Provider, MD  citalopram (CELEXA) 10 MG tablet Take 10 mg by mouth daily. 11/09/12  Yes Historical Provider, MD  levothyroxine (SYNTHROID, LEVOTHROID) 150 MCG tablet Take 150 mcg by mouth daily before breakfast.   Yes Historical Provider, MD  memantine (NAMENDA) 10 MG tablet Take 1 tablet (10 mg total) by mouth daily. 11/13/12  Yes Ileana Ladd, MD  Multiple  Vitamin (MULTIVITAMIN WITH MINERALS) TABS tablet Take 1 tablet by mouth daily.   Yes Historical Provider, MD  polyethylene glycol (MIRALAX / GLYCOLAX) packet Take 17 g by mouth daily.   Yes Historical Provider, MD    Inpatient Medications:     (Not in a hospital admission)  Family History  Problem Relation Age of Onset  . Diabetes Mother   . Coronary artery disease Mother   . Coronary artery disease Father      History   Social History  . Marital Status:  Married    Spouse Name: N/A    Number of Children: N/A  . Years of Education: N/A   Occupational History  . Not on file.   Social History Main Topics  . Smoking status: Never Smoker   . Smokeless tobacco: Not on file  . Alcohol Use: No  . Drug Use: No  . Sexual Activity: No   Other Topics Concern  . Not on file   Social History Narrative  . No narrative on file     Review of Systems:  Unable to obtain due to minimal responsiveness.   All other systems reviewed and are otherwise negative except as noted above.  Physical Exam: Blood pressure 134/53, pulse 45, temperature 97.5 F (36.4 C), temperature source Oral, resp. rate 15, SpO2 100.00%.    General: Thin, frail, minimally responsive, appears chronically ill Head: Normocephalic, atraumatic, sclera non-icteric, no xanthomas, nares are without discharge.  Neck: Negative for carotid bruits. JVD not elevated. Lungs: Clear bilaterally to auscultation without wheezes, rales, or rhonchi. Breathing is unlabored. Heart: Bradycardic, regular  S1 S2. No murmurs, rubs, or gallops   Abdomen: Soft, non-tender, non-distended with normoactive bowel sounds. No hepatomegaly. No rebound/guarding. No obvious abdominal masses. Msk:  Strength and tone appears normal for age. Extremities: No clubbing, cyanosis or edema.  Distal pedal pulses are 2+ and equal bilaterally. Neuro: Alert and oriented X 0. Awake, stuporous. Moves all extremities spontaneously. Psych:  Responds to questions appropriately with a normal affect.  Labs: Recent Labs     05/08/2013  1851  05/08/13  1926  WBC  15.4*   --   HGB  13.4  13.9  HCT  42.7  41.0  MCV  111.2*   --   PLT  103*   --    Recent Labs Lab 05-08-2013 1851 08-May-2013 1926  NA 157* 156*  K 3.9 4.0  CL 117* 120*  CO2 26  --   BUN 53* 54*  CREATININE 1.68* 1.80*  CALCIUM 12.8*  --   GLUCOSE 186* 181*   Radiology/Studies: Dg Chest Port 1 View  05-08-2013   CLINICAL DATA:  Cardiopulmonary  resuscitation.  Chest pain.  EXAM: PORTABLE CHEST - 1 VIEW  COMPARISON:  11/23/2012.  FINDINGS: Cardiopericardial silhouette is enlarged. There is no pneumothorax. No displaced rib fractures are identified. Surgical clips in the right breast. Monitoring leads project over the chest.  There is no edema. No airspace consolidation. Mild retrocardiac atelectasis.  IMPRESSION: Cardiomegaly without failure.   Electronically Signed   By: Andreas Newport M.D.   On: 05/08/2013 18:12    EKG: complete heart block, junctional rhythm underlying, 37 bpm, LAD, downlsoping ST dwepression with fairly symmetric TWIs V1-V4.   ASSESSMENT AND PLAN:  1. Cardiac arrest  2. Complete heart block 2. H/o nonischemic cardiomyopathy in 04/2011, EF 55% per echo 07/2012 3. Hypernatremia 4. A/CKD, stage III->IV 5. Dementia 6. PAF 7. Hypothyroidism 8. Thrombocytopenia 9.  Leukocytosis 10. Macrocytosis  11. Hypercalcemia  See comprehensive thoughts below.   Signed, R. Hurman Horn, PA-C 04/28/2013, 8:02 PM  Attending Note:   The patient was seen and examined.  Agree with assessment and plan as noted above.  Changes made to the above note as needed.  Ms. Lamora presents after surviving an out of hospital arrest.  She has multiple metabolic abnormalities.    At present, she appears to be in complete heart block.  Given her steady decline, I do not think she is a candidate for pacer.   She is very dehydrated - has not eaten well in some time.  I discussed feeding tube with her husband and her son.  Given the irreversibility of her condition and her steady decline, I would not recommend a feeding tube.    Following.   Vesta Mixer, Montez Hageman., MD, [facility] 04/08/2013, 8:19 PM

## 2013-04-24 LAB — TSH: TSH: 0.883 u[IU]/mL (ref 0.350–4.500)

## 2013-04-24 LAB — MRSA PCR SCREENING: MRSA by PCR: NEGATIVE

## 2013-04-24 MED ORDER — MORPHINE SULFATE 2 MG/ML IJ SOLN: 1.0000 mg | INTRAMUSCULAR | Status: DC | PRN

## 2013-04-24 MED ORDER — ATROPINE SULFATE 1 % OP SOLN
4.0000 [drp] | OPHTHALMIC | Status: DC | PRN
  Filled 2013-04-24: qty 2

## 2013-04-24 MED ORDER — SCOPOLAMINE 1 MG/3DAYS TD PT72
1.0000 | MEDICATED_PATCH | TRANSDERMAL | Status: DC
  Administered 2013-04-24: 1.5 mg via TRANSDERMAL
  Filled 2013-04-24: qty 1

## 2013-04-24 MED ORDER — LORAZEPAM 2 MG/ML IJ SOLN: 0.5000 mg | INTRAMUSCULAR | Status: DC | PRN

## 2013-04-24 NOTE — Progress Notes (Signed)
Nutrition Brief Note  RD consulted due to poor PO intake. Pt resting with family surrounding her at time of visit. Per RN pt is full comfort care, no nutritional needs at this time.   No nutrition interventions warranted at this time. If nutrition needs arise, please re-consult RD.   Ian Malkin RD, LDN Inpatient Clinical Dietitian Pager: [phone] After Hours Pager: [phone]

## 2013-04-24 NOTE — Progress Notes (Signed)
Patient Kristen Wheeler      DOB: [birth year]      WJX:[id number]  Able to visit with family affirmed goals are comfort no pain.  Added atropine and scopolamine.  Stopped oral medications.  Full consult to follow. Episode of respiratory distress treated with morphine, currently very comfortable.  Full note to follow.  Alexande Sheerin L. Ladona Ridgel, MD MBA The Palliative Medicine Team at [facility] Phone: [phone] Pager: [phone]  540 pm- 600 pm

## 2013-04-24 NOTE — Consult Note (Signed)
Patient ZO:XWRUEA L Holbein      DOB: March 02, 1941      VWU:[id number]  Patient seen and examined.  No family at the bedside.  Patient currently appears comfortable.  No respiratory distress.  Slight vocalization but not disturbing to her.  Will return to visit with family.  Full note to follow.   Agree with DNR Agree with morphine prn and ativan prn. Patient will likely not be able to take extraneous oral medication will likely discontinue after speaking with family.   Alaric Gladwin L. Ladona Ridgel, MD MBA The Palliative Medicine Team at [facility] Phone: [phone] Pager: [phone]

## 2013-04-24 NOTE — Progress Notes (Signed)
BP 142/52  Pulse 52  Temp(Src) 98.1 F (36.7 C) (Oral)  Resp 14  Wt 57.5 kg (126 lb 12.2 oz)  SpO2 100% - agree with plan as mention by Dr. Toniann Fail. - DNR/DNI. - morphine and ativan.

## 2013-04-29 DEATH — deceased

## 2013-05-23 NOTE — Discharge Summary (Signed)
Death Summary  Kristen Wheeler AJG:[id number] DOB: 01-Oct-1940 DOA: 2013/05/09  PCP: Anthoney Harada, MD  Admit date: 2013/05/09 Date of Death: 06-08-13  Final Diagnoses:  Principal Problem:   Complete heart block Active Problems:   Hypertension   Dementia   Dehydration   Hypernatremia   Renal failure (ARF), acute on chronic     Hospital Course:  I had discussed with patient's husband and son. As per the request patient has been made complete comfort measures with no aggressive measures. No further labs will be ordered. Patient will be continue gentle hydration and diet as tolerated. Consult hospice in a.m. Morphine when necessary for pain relief.    Time: 20 minutes  Signed:  Charlynne Cousins  [facility] 2013/06/08, 2:16 PM

## 2014-04-13 IMAGING — CT CT CERVICAL SPINE W/O CM
1 series · 12 of 14 positions shown, 15 images · non-contrast
Comparison: 04/11/2011 head CT

CT HEAD

CLINICAL DATA: 71-year-old female with fall, head and neck injury
with loss of consciousness, headache and neck pain.  History of
breast cancer.

CT HEAD WITHOUT CONTRAST
CT CERVICAL SPINE WITHOUT CONTRAST
TECHNIQUE: Multidetector CT imaging of the head and cervical spine
was performed following the standard protocol without intravenous
contrast.  Multiplanar CT image reconstructions of the cervical
spine were also generated.

[Series 3: head trauma 4.8 h37s · axial · 0.43mm/px · z∈[-593,-475]mm · 12 of 30 slices shown, 15 images]
[im 3/30  soft-tissue]
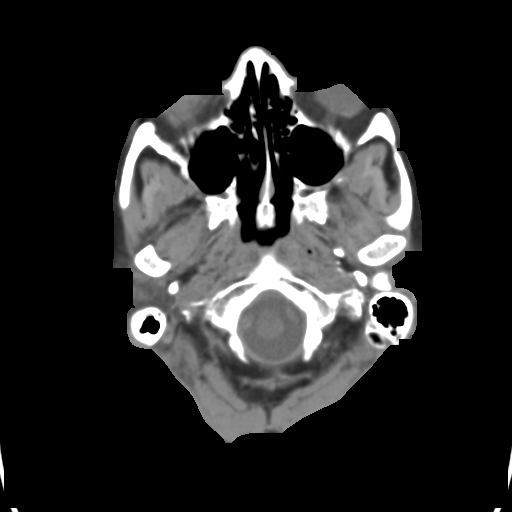
[im 3/30  bone]
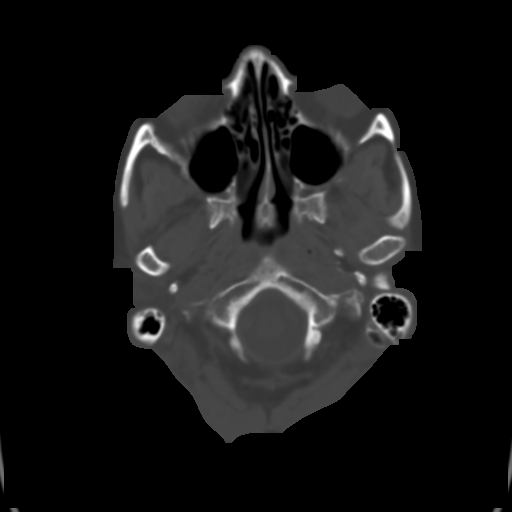
[im 5/30  bone]
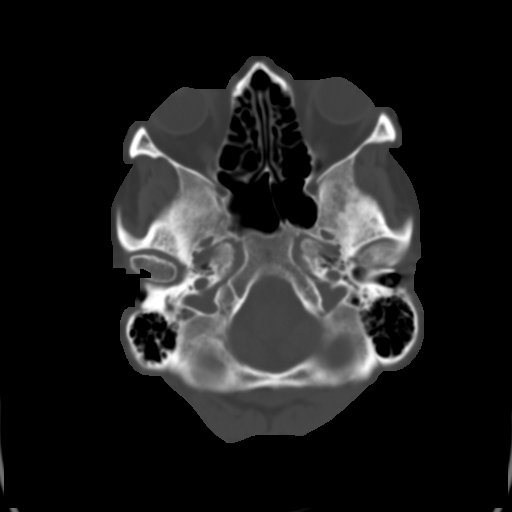
[im 7/30  bone]
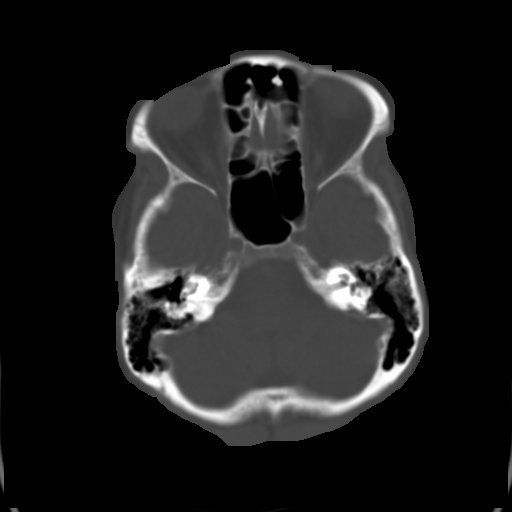
[im 9/30  bone]
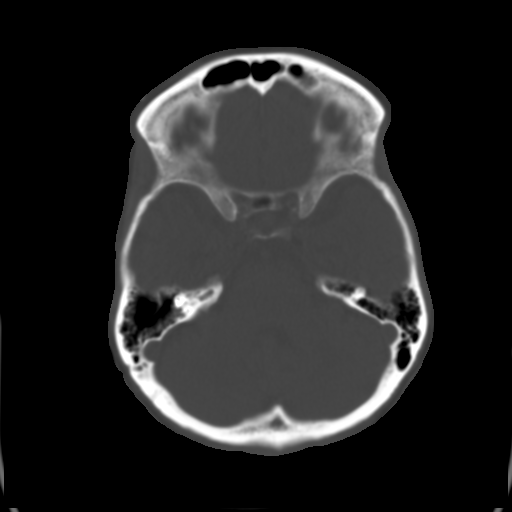
[im 12/30  soft-tissue]
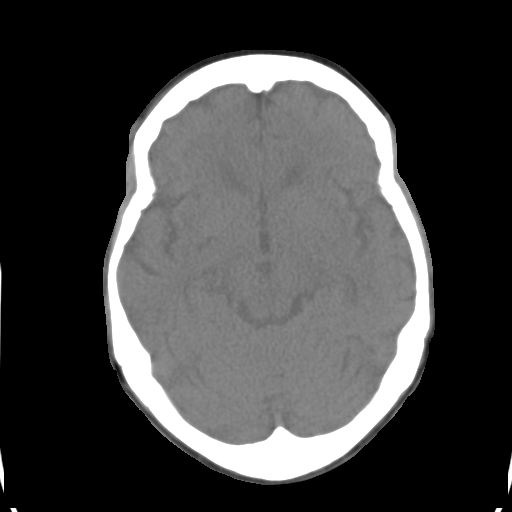
[im 12/30  bone]
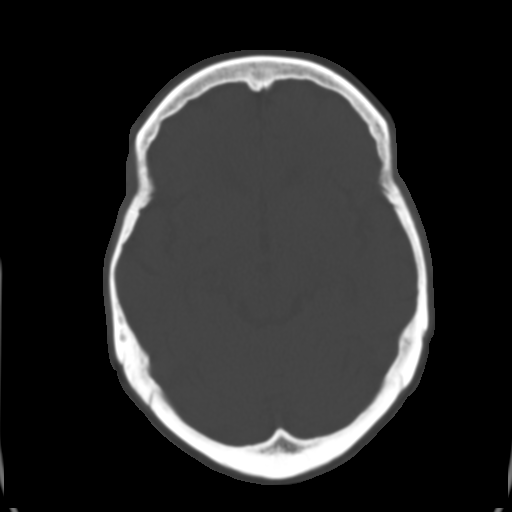
[im 14/30  bone]
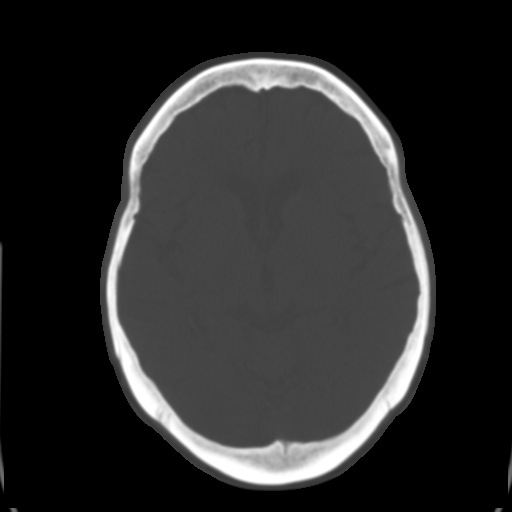
[im 16/30  bone]
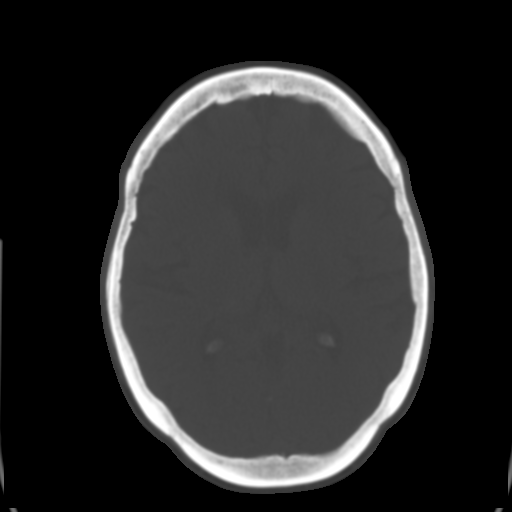
[im 18/30  bone]
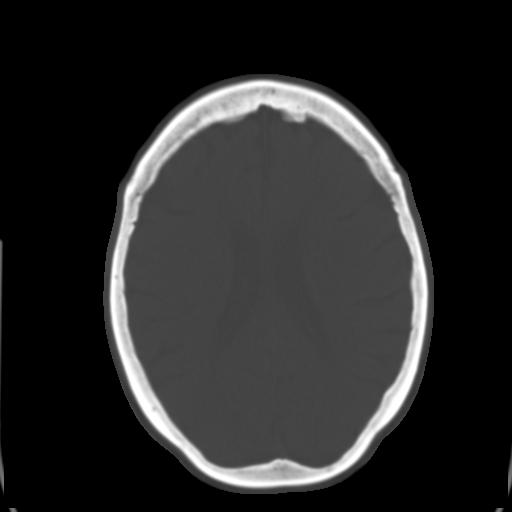
[im 21/30  soft-tissue]
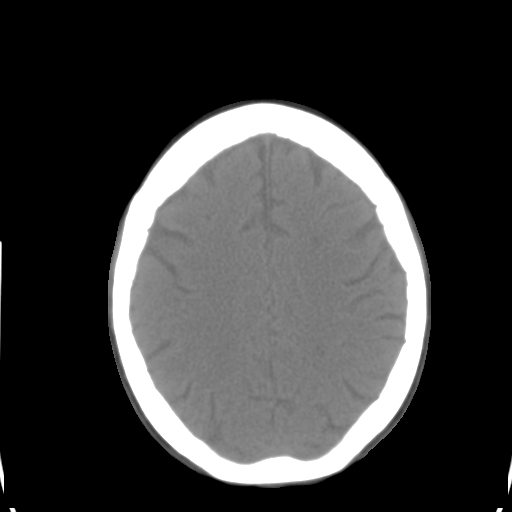
[im 21/30  bone]
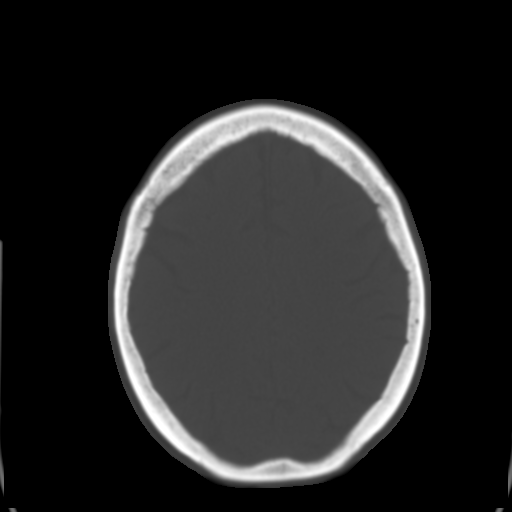
[im 23/30  bone]
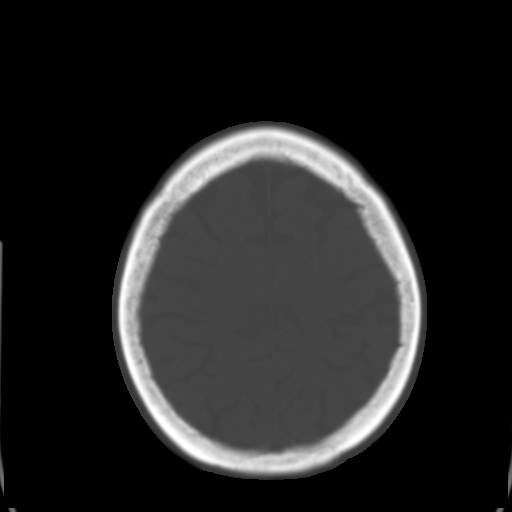
[im 25/30  bone]
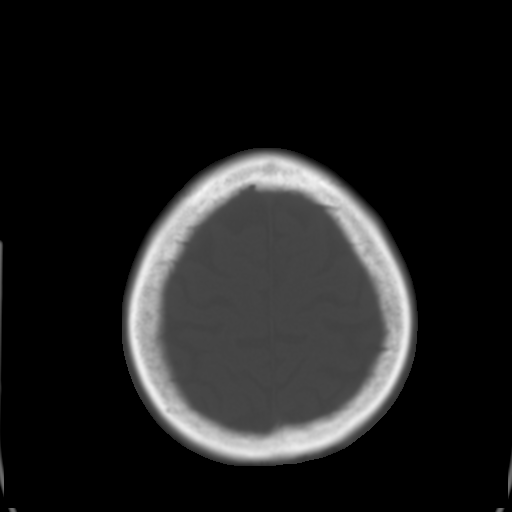
[im 27/30  bone]
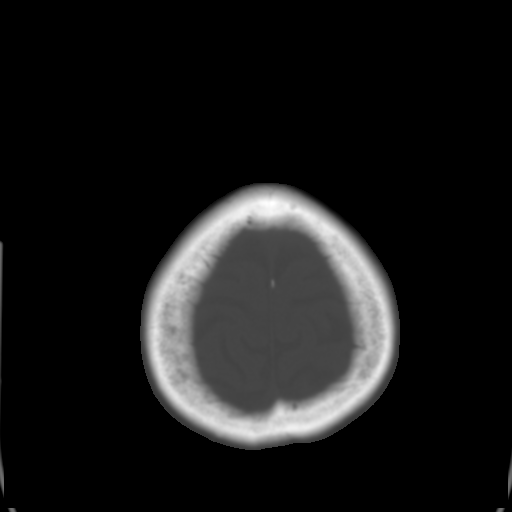

[12 of 14 positions shown; findings below may reference images not displayed]

FINDINGS: Mild chronic small vessel white matter ischemic changes
are identified.
No acute intracranial abnormalities are identified, including mass
lesion or mass effect, hydrocephalus, extra-axial fluid collection,
midline shift, hemorrhage, or acute infarction.

The visualized bony calvarium is unremarkable.
Right scalp soft tissue swelling is noted.
IMPRESSION: No evidence of acute intracranial abnormality.

Right scalp soft tissue swelling without fracture.

CT CERVICAL SPINE
FINDINGS: Normal alignment is noted.
There is no evidence of acute fracture, subluxation or prevertebral
soft tissue swelling.
Multilevel degenerative disc disease and spondylosis identified,
greatest at C4-C5 and C5-C6.  Mild central spinal narrowing
throughout the cervical spine is identified.
A moderate central disc protrusion at C3-C4 contacts the cord.
No other soft tissue abnormalities are identified.
No focal bony lesions are present.
IMPRESSION: No static evidence of acute injury to the cervical spine.

Moderate central disc protrusion at C3-C4 contacting the cord.

Multilevel degenerative changes.

## 2014-04-13 IMAGING — CR DG CHEST 1V
1 series · 1 of 1 positions shown · non-contrast
Comparison: Chest x-ray 04/16/2011.

CLINICAL DATA: History of trauma from a fall.

CHEST - 1 VIEW

[t chest supine]
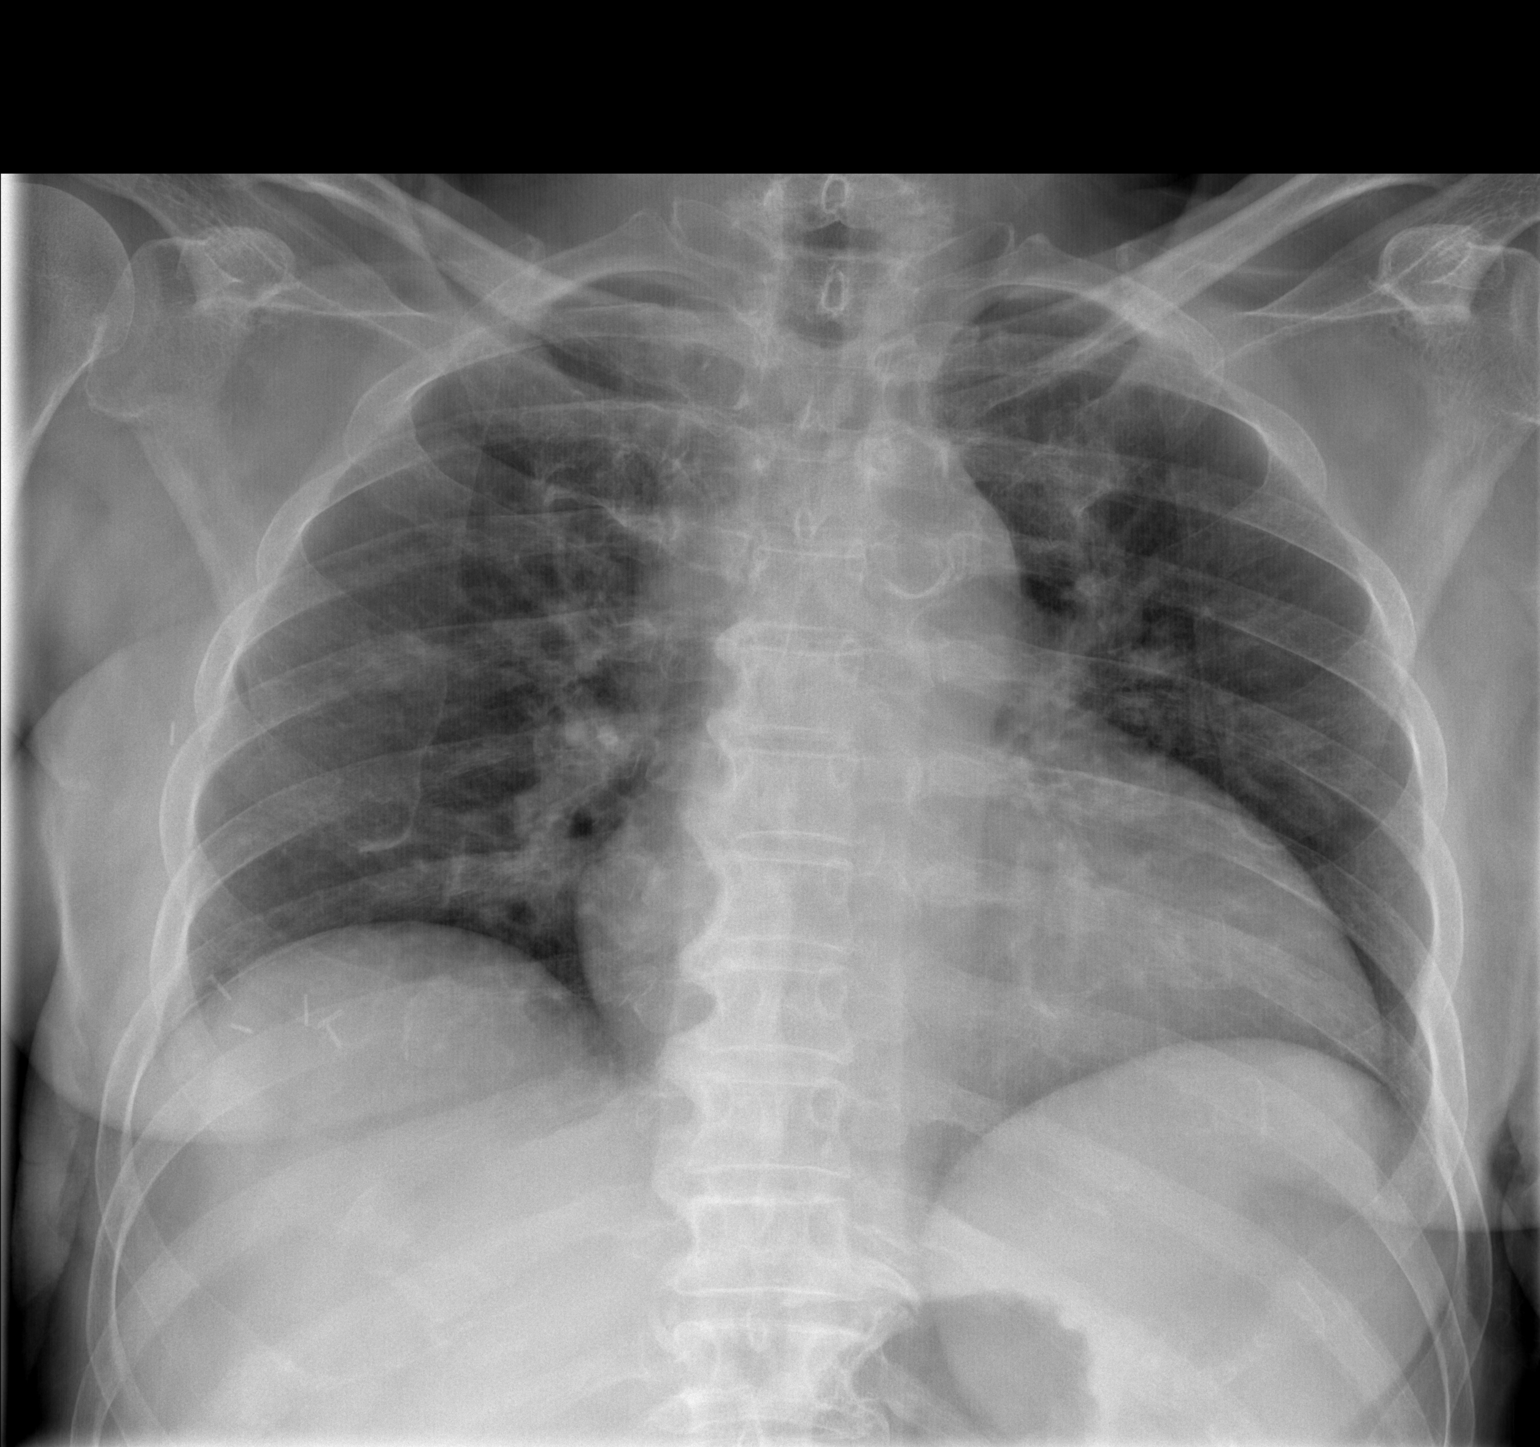

[1 of 1 positions shown; findings below may reference images not displayed]

FINDINGS: Lung volumes are slightly low.  No acute consolidative
airspace disease.  No pleural effusions.  Pulmonary vasculature is
within normal limits.  Heart size appears borderline enlarged
(likely accentuated by low lung volume).  Upper mediastinal
contours are within normal limits.  Atherosclerosis of the thoracic
aorta.  No pneumothorax.  Visualized bony thorax is grossly intact.
Numerous surgical clips project over the right breast, suggesting
prior lumpectomy.
IMPRESSION: 1.  Low lung volumes without radiographic evidence of acute
cardiopulmonary disease.
2.  Atherosclerosis.

## 2014-04-13 IMAGING — CR DG PELVIS 1-2V
2 series · 2 of 2 positions shown · non-contrast
Comparison: No priors.

CLINICAL DATA: History of trauma from a fall.

PELVIS - 1-2 VIEW

[t pelvis ap (1 of 2)]
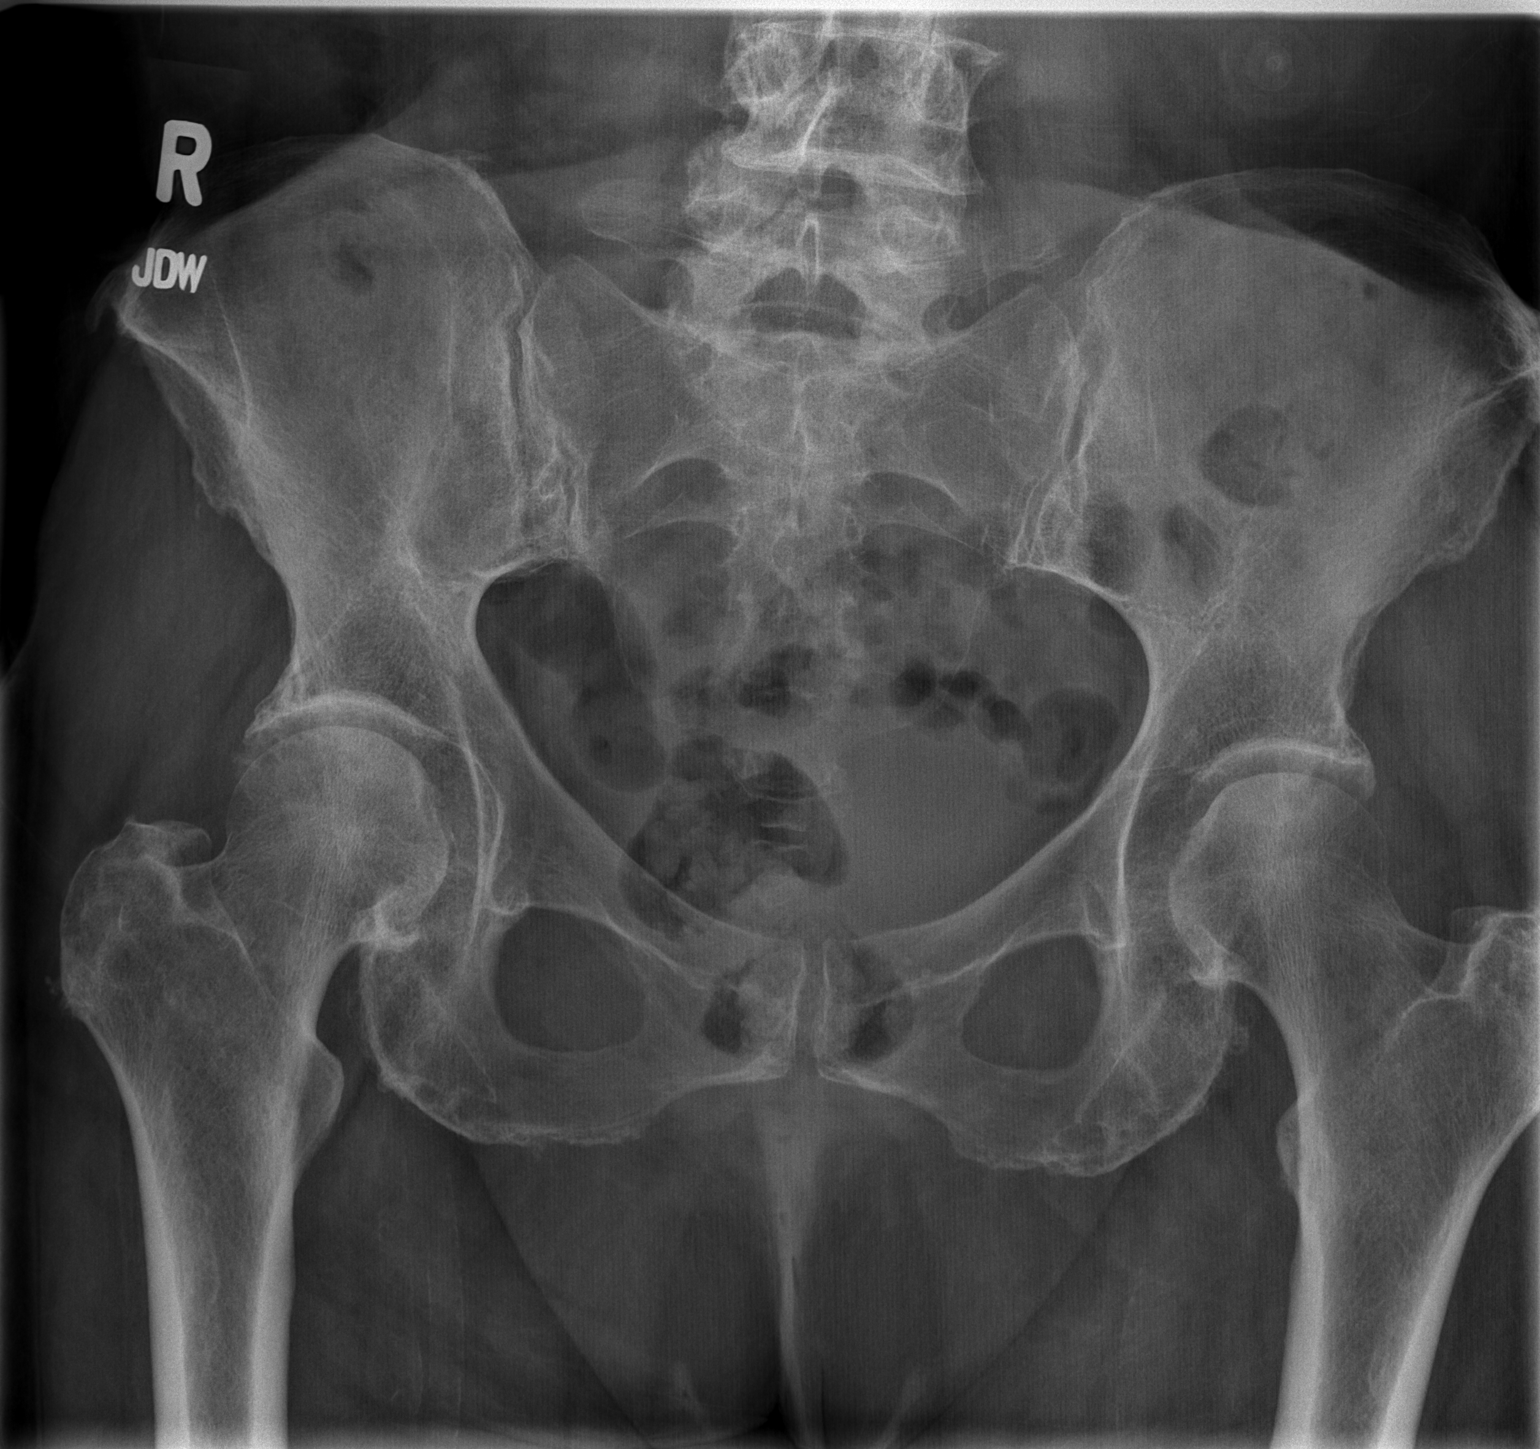

[t pelvis ap (2 of 2)]
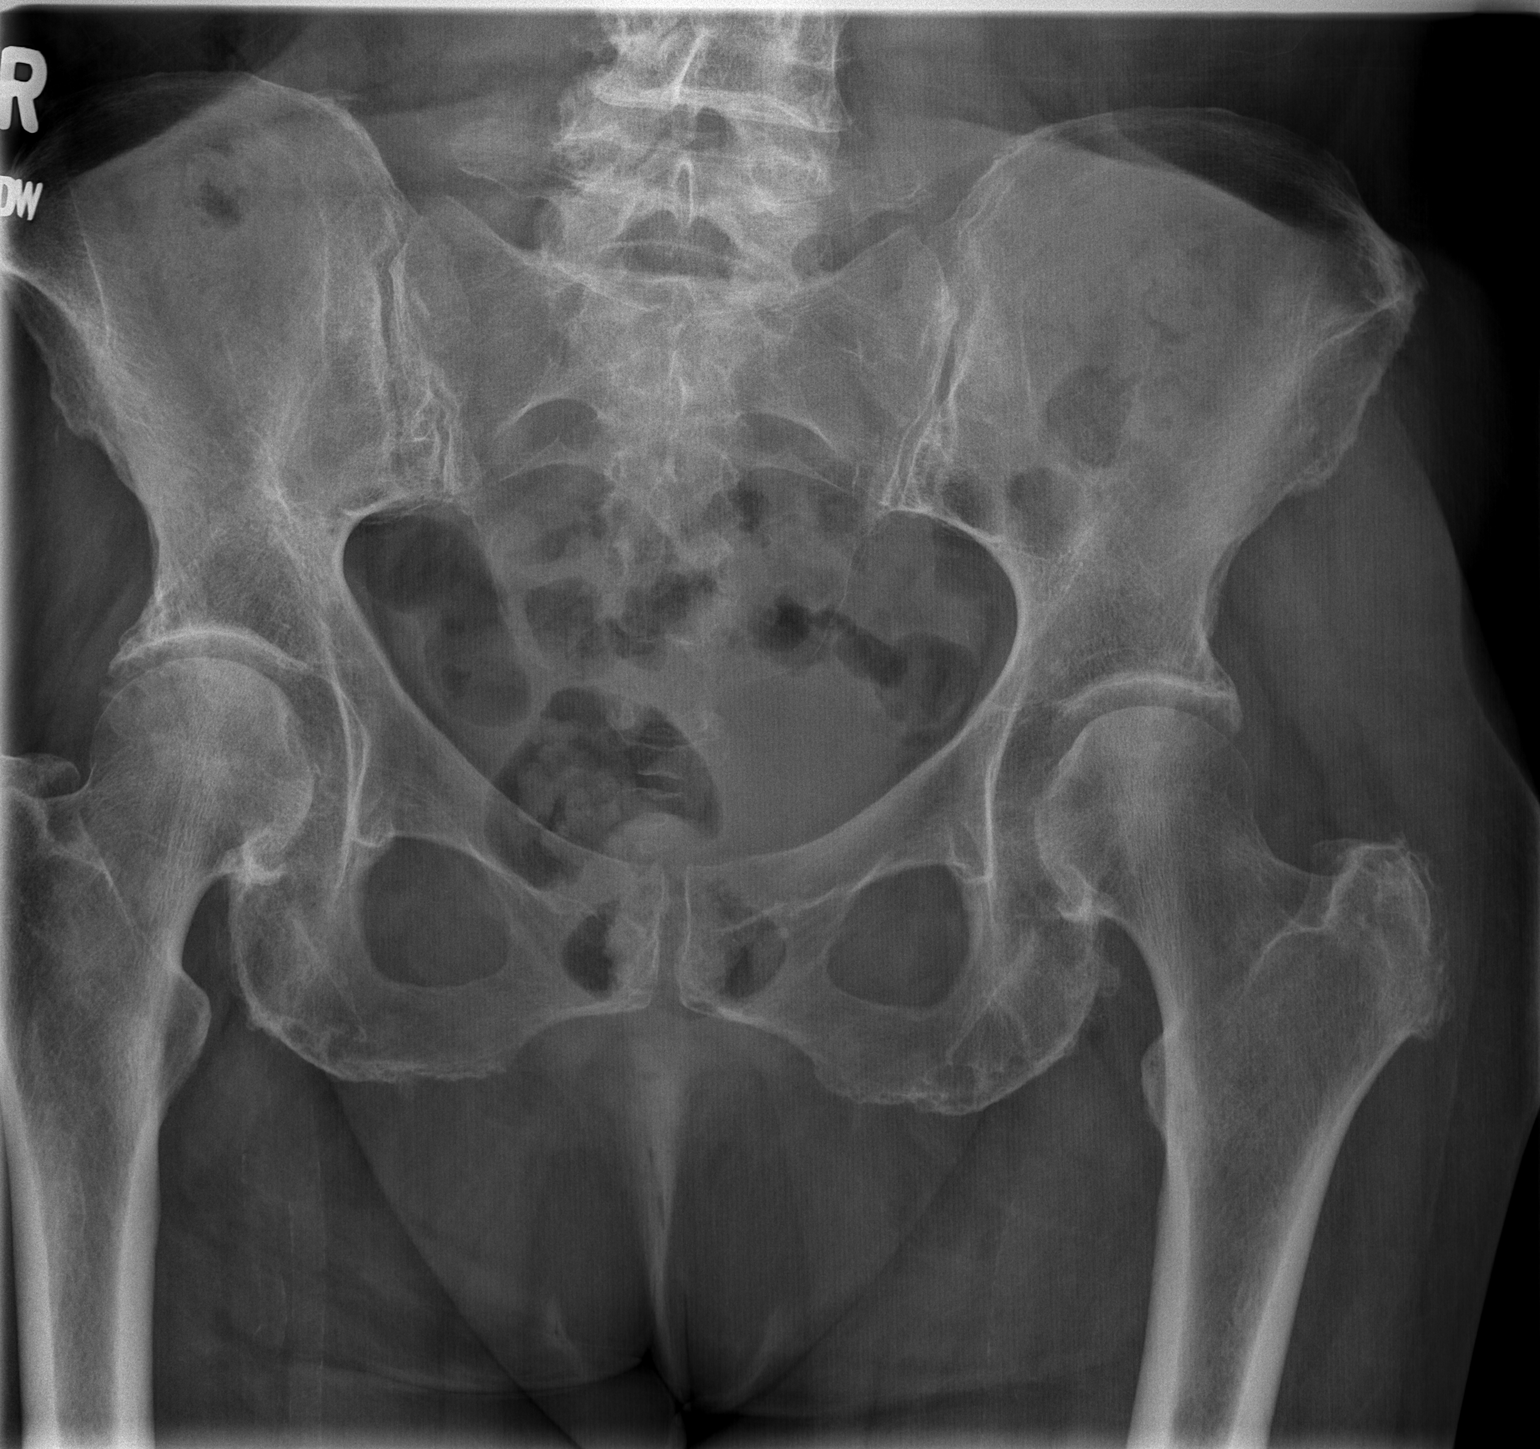

[2 of 2 positions shown; findings below may reference images not displayed]

FINDINGS: AP view of the pelvis demonstrates no acute displaced
fracture of the bony pelvic ring.  Bilateral proximal femurs as
visualized appear intact.  Femoral heads appear to project over the
acetabula bilaterally suggesting that they are properly located.
Joint space narrowing, subchondral sclerosis, subchondral cyst
formation and osteophyte formation is noted in the hip joints
bilaterally (right greater than left), with osteoarthritis.
IMPRESSION: 1.  No acute radiographic abnormality of the bony pelvis.
2.  Mild bilateral hip joint osteoarthritis (right greater than
left).
